# Patient Record
Sex: Male | Born: 1955
Health system: Southern US, Community
[De-identification: ages and names within clinical notes are randomized; demographics above are authoritative.]

## PROBLEM LIST (undated history)

## (undated) DIAGNOSIS — I2699 Other pulmonary embolism without acute cor pulmonale: Secondary | ICD-10-CM

## (undated) DIAGNOSIS — D6851 Activated protein C resistance: Secondary | ICD-10-CM

## (undated) DIAGNOSIS — I82409 Acute embolism and thrombosis of unspecified deep veins of unspecified lower extremity: Secondary | ICD-10-CM

## (undated) DIAGNOSIS — K219 Gastro-esophageal reflux disease without esophagitis: Secondary | ICD-10-CM

## (undated) DIAGNOSIS — Z86010 Personal history of colonic polyps: Secondary | ICD-10-CM

## (undated) DIAGNOSIS — I10 Essential (primary) hypertension: Secondary | ICD-10-CM

## (undated) DIAGNOSIS — Z86711 Personal history of pulmonary embolism: Secondary | ICD-10-CM

## (undated) DIAGNOSIS — M25552 Pain in left hip: Secondary | ICD-10-CM

## (undated) DIAGNOSIS — G8929 Other chronic pain: Secondary | ICD-10-CM

## (undated) DIAGNOSIS — E785 Hyperlipidemia, unspecified: Secondary | ICD-10-CM

## (undated) HISTORY — DX: Other pulmonary embolism without acute cor pulmonale: I26.99

## (undated) HISTORY — DX: Essential (primary) hypertension: I10

## (undated) HISTORY — DX: Personal history of colonic polyps: Z86.010

## (undated) HISTORY — DX: Gastro-esophageal reflux disease without esophagitis: K21.9

## (undated) HISTORY — DX: Pain in left hip: M25.552

## (undated) HISTORY — DX: Hyperlipidemia, unspecified: E78.5

## (undated) HISTORY — DX: Acute embolism and thrombosis of unspecified deep veins of unspecified lower extremity: I82.409

## (undated) HISTORY — DX: Personal history of pulmonary embolism: Z86.711

## (undated) HISTORY — DX: Other chronic pain: G89.29

## (undated) HISTORY — DX: Activated protein C resistance: D68.51

---
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
## 2004-04-27 ENCOUNTER — Ambulatory Visit (HOSPITAL_COMMUNITY): Admission: RE | Admit: 2004-04-27 | Discharge: 2004-04-27 | Payer: Self-pay | Admitting: Orthopedic Surgery

## 2005-04-05 ENCOUNTER — Emergency Department (HOSPITAL_COMMUNITY): Admission: EM | Admit: 2005-04-05 | Discharge: 2005-04-06 | Payer: Self-pay | Admitting: Emergency Medicine

## 2005-04-14 ENCOUNTER — Ambulatory Visit (HOSPITAL_COMMUNITY): Admission: RE | Admit: 2005-04-14 | Discharge: 2005-04-14 | Payer: Self-pay

## 2005-04-14 ENCOUNTER — Encounter (INDEPENDENT_AMBULATORY_CARE_PROVIDER_SITE_OTHER): Payer: Self-pay | Admitting: Specialist

## 2005-04-14 HISTORY — PX: CHOLECYSTECTOMY: SHX55

## 2005-05-30 ENCOUNTER — Ambulatory Visit: Payer: Self-pay | Admitting: Internal Medicine

## 2005-06-30 ENCOUNTER — Ambulatory Visit: Payer: Self-pay | Admitting: Family Medicine

## 2005-07-05 ENCOUNTER — Ambulatory Visit: Payer: Self-pay | Admitting: Family Medicine

## 2005-07-27 ENCOUNTER — Ambulatory Visit: Payer: Self-pay | Admitting: Internal Medicine

## 2005-08-15 ENCOUNTER — Ambulatory Visit: Payer: Self-pay | Admitting: Internal Medicine

## 2007-06-25 DIAGNOSIS — I10 Essential (primary) hypertension: Secondary | ICD-10-CM | POA: Insufficient documentation

## 2007-07-02 ENCOUNTER — Ambulatory Visit: Payer: Self-pay | Admitting: Family Medicine

## 2007-07-02 LAB — CONVERTED CEMR LAB
BUN: 13 mg/dL (ref 6–23)
Basophils Absolute: 0 10*3/uL (ref 0.0–0.1)
Basophils Relative: 0.4 % (ref 0.0–1.0)
Bilirubin, Direct: 0.2 mg/dL (ref 0.0–0.3)
Chloride: 110 meq/L (ref 96–112)
Cholesterol: 164 mg/dL (ref 0–200)
Eosinophils Relative: 1.9 % (ref 0.0–5.0)
GFR calc non Af Amer: 95 mL/min
Glucose, Bld: 119 mg/dL — ABNORMAL HIGH (ref 70–99)
HDL: 34 mg/dL — ABNORMAL LOW (ref >39.0)
LDL Cholesterol: 115 mg/dL — ABNORMAL HIGH (ref 0–99)
MCHC: 35.8 g/dL (ref 30.0–36.0)
Monocytes Absolute: 0.5 10*3/uL (ref 0.2–0.7)
Neutro Abs: 2.5 10*3/uL (ref 1.4–7.7)
Neutrophils Relative %: 57.3 % (ref 43.0–77.0)
RDW: 11.1 % — ABNORMAL LOW (ref 11.5–14.6)
Specific Gravity, Urine: 1.02
TSH: 1.12 microintl units/mL (ref 0.35–5.50)
Triglycerides: 75 mg/dL (ref 0–149)
Urobilinogen, UA: 0.2
VLDL: 15 mg/dL (ref 0–40)
WBC: 4.4 10*3/uL — ABNORMAL LOW (ref 4.5–10.5)

## 2007-07-09 ENCOUNTER — Ambulatory Visit: Payer: Self-pay | Admitting: Family Medicine

## 2007-10-15 ENCOUNTER — Ambulatory Visit: Payer: Self-pay | Admitting: Family Medicine

## 2007-10-15 DIAGNOSIS — M659 Synovitis and tenosynovitis, unspecified: Secondary | ICD-10-CM | POA: Insufficient documentation

## 2007-10-15 DIAGNOSIS — M7150 Other bursitis, not elsewhere classified, unspecified site: Secondary | ICD-10-CM | POA: Insufficient documentation

## 2008-02-21 ENCOUNTER — Telehealth: Payer: Self-pay | Admitting: Family Medicine

## 2008-04-21 ENCOUNTER — Telehealth: Payer: Self-pay | Admitting: Family Medicine

## 2008-08-04 ENCOUNTER — Ambulatory Visit: Payer: Self-pay | Admitting: Family Medicine

## 2008-08-04 LAB — CONVERTED CEMR LAB
Blood in Urine, dipstick: NEGATIVE
Glucose, Urine, Semiquant: NEGATIVE
Ketones, urine, test strip: NEGATIVE
Nitrite: NEGATIVE
pH: 7.5

## 2008-08-06 LAB — CONVERTED CEMR LAB
ALT: 26 units/L (ref 0–53)
AST: 26 units/L (ref 0–37)
Albumin: 4.2 g/dL (ref 3.5–5.2)
Basophils Absolute: 0 10*3/uL (ref 0.0–0.1)
Basophils Relative: 0.6 % (ref 0.0–3.0)
Calcium: 9.6 mg/dL (ref 8.4–10.5)
Chloride: 114 meq/L — ABNORMAL HIGH (ref 96–112)
Creatinine, Ser: 1 mg/dL (ref 0.4–1.5)
Eosinophils Relative: 1.3 % (ref 0.0–5.0)
HDL: 36.8 mg/dL — ABNORMAL LOW (ref >39.0)
LDL Cholesterol: 134 mg/dL — ABNORMAL HIGH (ref 0–99)
MCHC: 35.1 g/dL (ref 30.0–36.0)
MCV: 96 fL (ref 78.0–100.0)
Neutrophils Relative %: 59.2 % (ref 43.0–77.0)
Platelets: 219 10*3/uL (ref 150–400)
Potassium: 5.2 meq/L — ABNORMAL HIGH (ref 3.5–5.1)
RBC: 4.71 M/uL (ref 4.22–5.81)
Sodium: 145 meq/L (ref 135–145)
TSH: 1.31 microintl units/mL (ref 0.35–5.50)
Total CHOL/HDL Ratio: 4.9
VLDL: 9 mg/dL (ref 0–40)
WBC: 4.2 10*3/uL — ABNORMAL LOW (ref 4.5–10.5)

## 2008-08-11 ENCOUNTER — Ambulatory Visit: Payer: Self-pay | Admitting: Family Medicine

## 2008-08-11 DIAGNOSIS — E785 Hyperlipidemia, unspecified: Secondary | ICD-10-CM

## 2008-08-11 DIAGNOSIS — K219 Gastro-esophageal reflux disease without esophagitis: Secondary | ICD-10-CM | POA: Insufficient documentation

## 2008-08-13 ENCOUNTER — Telehealth: Payer: Self-pay | Admitting: Family Medicine

## 2009-03-03 ENCOUNTER — Ambulatory Visit: Payer: Self-pay | Admitting: Family Medicine

## 2009-03-03 DIAGNOSIS — R209 Unspecified disturbances of skin sensation: Secondary | ICD-10-CM | POA: Insufficient documentation

## 2009-03-04 LAB — CONVERTED CEMR LAB
Basophils Relative: 0.6 % (ref 0.0–3.0)
CO2: 31 meq/L (ref 19–32)
Calcium: 8.9 mg/dL (ref 8.4–10.5)
Creatinine, Ser: 0.9 mg/dL (ref 0.4–1.5)
Eosinophils Relative: 1.2 % (ref 0.0–5.0)
Glucose, Bld: 99 mg/dL (ref 70–99)
Lymphocytes Relative: 32.1 % (ref 12.0–46.0)
MCHC: 35 g/dL (ref 30.0–36.0)
MCV: 92.8 fL (ref 78.0–100.0)
Monocytes Absolute: 0.5 10*3/uL (ref 0.1–1.0)
Monocytes Relative: 9.3 % (ref 3.0–12.0)
Neutro Abs: 2.9 10*3/uL (ref 1.4–7.7)
Neutrophils Relative %: 56.8 % (ref 43.0–77.0)
Platelets: 199 10*3/uL (ref 150.0–400.0)
RDW: 11.4 % — ABNORMAL LOW (ref 11.5–14.6)
Sodium: 142 meq/L (ref 135–145)
WBC: 5.2 10*3/uL (ref 4.5–10.5)

## 2009-03-11 ENCOUNTER — Encounter: Payer: Self-pay | Admitting: Family Medicine

## 2009-03-17 ENCOUNTER — Encounter: Payer: Self-pay | Admitting: Family Medicine

## 2009-04-06 ENCOUNTER — Ambulatory Visit (HOSPITAL_COMMUNITY): Admission: RE | Admit: 2009-04-06 | Discharge: 2009-04-06 | Payer: Self-pay | Admitting: Neurology

## 2009-04-27 ENCOUNTER — Encounter: Payer: Self-pay | Admitting: Family Medicine

## 2009-06-16 ENCOUNTER — Ambulatory Visit: Payer: Self-pay | Admitting: Family Medicine

## 2009-06-16 DIAGNOSIS — K5289 Other specified noninfective gastroenteritis and colitis: Secondary | ICD-10-CM

## 2009-11-10 ENCOUNTER — Ambulatory Visit: Payer: Self-pay | Admitting: Family Medicine

## 2009-11-16 ENCOUNTER — Telehealth: Payer: Self-pay | Admitting: Family Medicine

## 2009-11-16 DIAGNOSIS — K645 Perianal venous thrombosis: Secondary | ICD-10-CM

## 2010-05-03 ENCOUNTER — Telehealth: Payer: Self-pay | Admitting: Family Medicine

## 2010-05-06 ENCOUNTER — Encounter: Payer: Self-pay | Admitting: Family Medicine

## 2010-08-20 ENCOUNTER — Ambulatory Visit: Payer: Self-pay | Admitting: Family Medicine

## 2010-08-20 DIAGNOSIS — F411 Generalized anxiety disorder: Secondary | ICD-10-CM | POA: Insufficient documentation

## 2010-08-23 LAB — CONVERTED CEMR LAB
Alkaline Phosphatase: 67 units/L (ref 39–117)
Chloride: 105 meq/L (ref 96–112)
Creatinine, Ser: 1 mg/dL (ref 0.4–1.5)
Eosinophils Absolute: 0.1 10*3/uL (ref 0.0–0.7)
Eosinophils Relative: 1.7 % (ref 0.0–5.0)
Lymphocytes Relative: 30 % (ref 12.0–46.0)
Lymphs Abs: 1.6 10*3/uL (ref 0.7–4.0)
MCHC: 35.2 g/dL (ref 30.0–36.0)
MCV: 95.8 fL (ref 78.0–100.0)
Monocytes Absolute: 0.5 10*3/uL (ref 0.1–1.0)
Monocytes Relative: 10.3 % (ref 3.0–12.0)
Neutrophils Relative %: 57.3 % (ref 43.0–77.0)
Platelets: 208 10*3/uL (ref 150.0–400.0)
Potassium: 4.1 meq/L (ref 3.5–5.1)
RBC: 4.7 M/uL (ref 4.22–5.81)
RDW: 11.9 % (ref 11.5–14.6)
TSH: 1.71 microintl units/mL (ref 0.35–5.50)
Total Bilirubin: 2.1 mg/dL — ABNORMAL HIGH (ref 0.3–1.2)
Total Protein: 6.7 g/dL (ref 6.0–8.3)

## 2010-09-03 ENCOUNTER — Encounter (INDEPENDENT_AMBULATORY_CARE_PROVIDER_SITE_OTHER): Payer: Self-pay | Admitting: *Deleted

## 2010-10-04 ENCOUNTER — Encounter (INDEPENDENT_AMBULATORY_CARE_PROVIDER_SITE_OTHER): Payer: Self-pay

## 2010-10-05 ENCOUNTER — Ambulatory Visit: Payer: Self-pay | Admitting: Internal Medicine

## 2010-10-19 ENCOUNTER — Ambulatory Visit: Payer: Self-pay | Admitting: Internal Medicine

## 2010-10-19 DIAGNOSIS — Z860100 Personal history of colon polyps, unspecified: Secondary | ICD-10-CM | POA: Insufficient documentation

## 2010-10-19 HISTORY — DX: Personal history of colonic polyps: Z86.010

## 2010-10-25 ENCOUNTER — Encounter: Payer: Self-pay | Admitting: Internal Medicine

## 2010-11-17 ENCOUNTER — Ambulatory Visit
Admission: RE | Admit: 2010-11-17 | Discharge: 2010-11-17 | Payer: Self-pay | Source: Home / Self Care | Attending: Sports Medicine | Admitting: Sports Medicine

## 2010-11-17 DIAGNOSIS — M79609 Pain in unspecified limb: Secondary | ICD-10-CM | POA: Insufficient documentation

## 2010-12-14 NOTE — Letter (Signed)
Summary: Physician Medical Evaluation/[facility]  Physician Medical Evaluation/[facility]   Imported By: Maryln Gottron 05/10/2010 13:13:45  _____________________________________________________________________  External Attachment:    Type:   Image     Comment:   External Document

## 2010-12-14 NOTE — Progress Notes (Signed)
Summary: has form  Phone Note Call from Patient Call back at [phone]   Caller: Patient Call For: Nelwyn Salisbury MD Summary of Call: needs health form filled out for [employer]- need OV or can he drop off? Initial call taken by: VM  Follow-up for Phone Call        he can just drop it off for me, but ask him to tell us what his weight and height are now  Follow-up by: Nelwyn Salisbury MD,  May 03, 2010 1:43 PM  Additional Follow-up for Phone Call Additional follow up Details #1::        left msg Additional Follow-up by: Raechel Ache, RN,  May 03, 2010 2:05 PM

## 2010-12-14 NOTE — Letter (Signed)
Summary: Pre Visit Letter Revised  [facility]  [address] [place], [place] [address]   Phone: [phone]  Fax: [phone]        09/03/2010 MRN: [id number] Joe Molina [address] [place], [place]  [address]             Procedure Date:  10/19/2010   Welcome to the Gastroenterology Division at [facility].    You are scheduled to see a nurse for your pre-procedure visit on 10/05/2010 at 4:30PM on the 3rd floor at [facility], [address]  We ask that you try to arrive at our office 15 minutes prior to your appointment time to allow for check-in.  Please take a minute to review the attached form.  If you answer "Yes" to one or more of the questions on the first page, we ask that you call the person listed at your earliest opportunity.  If you answer "No" to all of the questions, please complete the rest of the form and bring it to your appointment.    Your nurse visit will consist of discussing your medical and surgical history, your immediate family medical history, and your medications.   If you are unable to list all of your medications on the form, please bring the medication bottles to your appointment and we will list them.  We will need to be aware of both prescribed and over the counter drugs.  We will need to know exact dosage information as well.    Please be prepared to read and sign documents such as consent forms, a financial agreement, and acknowledgement forms.  If necessary, and with your consent, a friend or relative is welcome to sit-in on the nurse visit with you.  Please bring your insurance card so that we may make a copy of it.  If your insurance requires a referral to see a specialist, please bring your referral form from your primary care physician.  No co-pay is required for this nurse visit.     If you cannot keep your appointment, please call [phone] to cancel or reschedule prior to your appointment date.  This allows [place]  the opportunity to schedule an appointment for another patient in need of care.    Thank you for choosing [facility] for your medical needs.  We appreciate the opportunity to care for you.  Please visit [place] at our website  to learn more about our practice.  Sincerely, The Gastroenterology Division

## 2010-12-14 NOTE — Progress Notes (Signed)
Summary: referral  Phone Note Call from Patient Call back at Home Phone [phone]   Caller: Patient Summary of Call: Pt. sent me an email over the weekend that his hemorrhoids are still bothering him a lot despite the medicated cream. Would like a referral about these. Please refer him to Surgery this week about a painful thrombosed hemorrhoid.  Initial call taken by: Nelwyn Salisbury MD,  November 16, 2009 9:17 AM  Follow-up for Phone Call        Phone Call Completed Follow-up by: Alfred Levins, CMA,  November 16, 2009 11:57 AM  New Problems: HEMORRHOID, EXTERNAL, THROMBOSED (ICD-455.4)   New Problems: HEMORRHOID, EXTERNAL, THROMBOSED (ICD-455.4)

## 2010-12-14 NOTE — Letter (Signed)
Summary: [facility] Instructions  [facility]  [address] [place], [place] [address]   Phone: [phone]  Fax: [phone]       Joe Molina    [birth year]    MRN: [id number]        Procedure Day /Date: Tuesday 10-19-10     Arrival Time: 8:00 a.m.     Procedure Time: 9:00 a.m.     Location of Procedure:                    _x _  [facility] (4th Floor)                        PREPARATION FOR COLONOSCOPY WITH MOVIPREP   Starting 5 days prior to your procedure  10-14-10 do not eat nuts, seeds, popcorn, corn, beans, peas,  salads, or any raw vegetables.  Do not take any fiber supplements (e.g. Metamucil, Citrucel, and Benefiber).  THE DAY BEFORE YOUR PROCEDURE         DATE:  10-18-10  DAY:  Monday  1.  Drink clear liquids the entire day-NO SOLID FOOD  2.  Do not drink anything colored red or purple.  Avoid juices with pulp.  No orange juice.  3.  Drink at least 64 oz. (8 glasses) of fluid/clear liquids during the day to prevent dehydration and help the prep work efficiently.  CLEAR LIQUIDS INCLUDE: Water Jello Ice Popsicles Tea (sugar ok, no milk/cream) Powdered fruit flavored drinks Coffee (sugar ok, no milk/cream) Gatorade Juice: apple, white grape, white cranberry  Lemonade Clear bullion, consomm, broth Carbonated beverages (any kind) Strained chicken noodle soup Hard Candy                             4.  In the morning, mix first dose of MoviPrep solution:    Empty 1 Pouch A and 1 Pouch B into the disposable container    Add lukewarm drinking water to the top line of the container. Mix to dissolve    Refrigerate (mixed solution should be used within 24 hrs)  5.  Begin drinking the prep at 5:00 p.m. The MoviPrep container is divided by 4 marks.   Every 15 minutes drink the solution down to the next mark (approximately 8 oz) until the full liter is complete.   6.  Follow completed prep with 16 oz of clear liquid of your choice  (Nothing red or purple).  Continue to drink clear liquids until bedtime.  7.  Before going to bed, mix second dose of MoviPrep solution:    Empty 1 Pouch A and 1 Pouch B into the disposable container    Add lukewarm drinking water to the top line of the container. Mix to dissolve    Refrigerate  THE DAY OF YOUR PROCEDURE      DATE:  10-19-10  DAY:  Tuesday  Beginning at  4:00 a.m. (5 hours before procedure):         1. Every 15 minutes, drink the solution down to the next mark (approx 8 oz) until the full liter is complete.  2. Follow completed prep with 16 oz. of clear liquid of your choice.    3. You may drink clear liquids until  7:00 a.m. (2 HOURS BEFORE PROCEDURE).   MEDICATION INSTRUCTIONS  Unless otherwise instructed, you should take regular prescription medications with a small sip of water  as early as possible the morning of your procedure.         OTHER INSTRUCTIONS  You will need a responsible adult at least 56 years of age to accompany you and drive you home.   This person must remain in the waiting room during your procedure.  Wear loose fitting clothing that is easily removed.  Leave jewelry and other valuables at home.  However, you may wish to bring a book to read or  an iPod/MP3 player to listen to music as you wait for your procedure to start.  Remove all body piercing jewelry and leave at home.  Total time from sign-in until discharge is approximately 2-3 hours.  You should go home directly after your procedure and rest.  You can resume normal activities the  day after your procedure.  The day of your procedure you should not:   Drive   Make legal decisions   Operate machinery   Drink alcohol   Return to work  You will receive specific instructions about eating, activities and medications before you leave.    The above instructions have been reviewed and explained to me by   Ulis Rias RN  October 05, 2010 5:17 PM     I fully  understand and can verbalize these instructions _____________________________ Date _________

## 2010-12-14 NOTE — Assessment & Plan Note (Signed)
Summary: CONSULT RE: HIGH BP READINGS/CJR   Vital Signs:  Patient profile:   55 year old male Weight:      204 pounds O2 Sat:      97 % Temp:     99 degrees F Pulse rate:   61 / minute BP sitting:   140 / 104  (left arm) Cuff size:   large  Vitals Entered By: Pura Spice, RN (August 20, 2010 10:01 AM)  Serial Vital Signs/Assessments:  Time      Position  BP       Pulse  Resp  Temp     By           R Arm     146/104                        Pura Spice, RN   History of Present Illness: Here for elevated BP readings in the past few weeks. He has been getting systolics of 150-160 and diastolics of 90-100. He has felt a pressure in the head, but no HA. His tinnitus has been actin g up. No chest pain or SOB. He admits to not exercising and being under a lot of stress, both from family issues and from work. He took Lisinopril several years ago and tolerated it well.   Allergies: 1)  Codeine Sulfate (Codeine Sulfate)  Past History:  Past Medical History: Reviewed history from 08/11/2008 and no changes required. Hypertension GERD Hyperlipidemia  Past Surgical History: Reviewed history from 08/11/2008 and no changes required. Cholecystectomy 6-06 per Dr. Orson Slick colonoscopy 08-15-05 per Dr. Leone Payor, repeat in 5 yrs  Review of Systems  The patient denies anorexia, fever, weight loss, weight gain, vision loss, decreased hearing, hoarseness, chest pain, syncope, dyspnea on exertion, peripheral edema, prolonged cough, headaches, hemoptysis, abdominal pain, melena, hematochezia, severe indigestion/heartburn, hematuria, incontinence, genital sores, muscle weakness, suspicious skin lesions, transient blindness, difficulty walking, depression, unusual weight change, abnormal bleeding, enlarged lymph nodes, angioedema, breast masses, and testicular masses.    Physical Exam  General:  Well-developed,well-nourished,in no acute distress; alert,appropriate and cooperative throughout  examination Neck:  No deformities, masses, or tenderness noted. Lungs:  Normal respiratory effort, chest expands symmetrically. Lungs are clear to auscultation, no crackles or wheezes. Heart:  Normal rate and regular rhythm. S1 and S2 normal without gallop, murmur, click, rub or other extra sounds. Psych:  Oriented X3, memory intact for recent and remote, normally interactive, good eye contact, and slightly anxious.     Impression & Recommendations:  Problem # 1:  HYPERTENSION (ICD-401.9)  His updated medication list for this problem includes:    Lisinopril 10 Mg Tabs (Lisinopril) ..... Once daily  Orders: Venipuncture (71696) TLB-BMP (Basic Metabolic Panel-BMET) (80048-METABOL) TLB-CBC Platelet - w/Differential (85025-CBCD) TLB-Hepatic/Liver Function Pnl (80076-HEPATIC) TLB-TSH (Thyroid Stimulating Hormone) (84443-TSH) Specimen Handling (78938)  Problem # 2:  ANXIETY STATE, UNSPECIFIED (ICD-300.00)  Complete Medication List: 1)  Fish Oil Oil (Fish oil) .... Once daily 2)  Red Yeast Rice 600 Mg Caps (Red yeast rice extract) .... Once daily 3)  Nexium 40 Mg Cpdr (Esomeprazole magnesium) .... Take 1 tab each morning 4)  Anusol-hc 2.5 % Crea (Hydrocortisone) .... Apply three times a day as needed 5)  Lisinopril 10 Mg Tabs (Lisinopril) .... Once daily  Patient Instructions: 1)  We will get back on Lisinopril today and check some labs. I encouraged him to exercise and try to do something fun this weekend. he will  contact Dr. Caralyn Guile to start some therapy also.  2)  Please schedule a follow-up appointment in 1 month.  Prescriptions: LISINOPRIL 10 MG TABS (LISINOPRIL) once daily  #30 x 5   Entered and Authorized by:   Nelwyn Salisbury MD   Signed by:   Nelwyn Salisbury MD on 08/20/2010   Method used:   Electronically to        [facility]* (retail)       [address]       [address] Shipping/mailing       [place], [place]  [address]       Ph:  [phone]       Fax: [phone]   RxID:   [id number]

## 2010-12-14 NOTE — Miscellaneous (Signed)
Summary: Lec previsit  Clinical Lists Changes  Medications: Added new medication of MOVIPREP 100 GM  SOLR (PEG-KCL-NACL-NASULF-NA ASC-C) As per prep instructions. - Signed Rx of MOVIPREP 100 GM  SOLR (PEG-KCL-NACL-NASULF-NA ASC-C) As per prep instructions.;  #1 x 0;  Signed;  Entered by: Ulis Rias RN;  Authorized by: Iva Boop MD, Clementeen Graham;  Method used: Electronically to [facility]*, [address], [address] Shipping/mailing, [place], [place]  [address], Ph: [phone], Fax: [phone] Observations: Added new observation of ALLERGY REV: Done (10/05/2010 16:17)    Prescriptions: MOVIPREP 100 GM  SOLR (PEG-KCL-NACL-NASULF-NA ASC-C) As per prep instructions.  #1 x 0   Entered by:   Ulis Rias RN   Authorized by:   Iva Boop MD, [facility]   Signed by:   Ulis Rias RN on 10/05/2010   Method used:   Electronically to        [facility]* (retail)       [address]       [address] Shipping/mailing       [place], [place]  [address]       Ph: [phone]       Fax: [phone]   RxID:   [id number]

## 2010-12-16 NOTE — Procedures (Signed)
Summary: Colonoscopy  Patient: [name] Note: All result statuses are Final unless otherwise noted.  Tests: (1) Colonoscopy (COL)   COL Colonoscopy           DONE (C)     [facility]     [address]     [place], [place]  [address]           COLONOSCOPY PROCEDURE REPORT           PATIENT:  Joe Molina, Joe Molina  MR#:  [id number]     BIRTHDATE:  [birth year], 54 yrs. old  GENDER:  male     ENDOSCOPIST:  Iva Boop, MD, [facility]           PROCEDURE DATE:  10/19/2010     PROCEDURE:  Colonoscopy with biopsy and snare polypectomy     ASA CLASS:  Class II     INDICATIONS:  Routine Risk Screening returned at years due to deep     folds in sigmoid (thought to limit visualization)     MEDICATIONS:  None CORRECTION: Fentalyl 50 micrograms IV and     Versed 4 milligrams IV           DESCRIPTION OF PROCEDURE:   After the risks benefits and     alternatives of the procedure were thoroughly explained, informed     consent was obtained.  No rectal exam performed. The LB CF-[id number]     [id number] endoscope was introduced through the anus and advanced to     the cecum, which was identified by both the appendix and ileocecal     valve, without limitations.  The quality of the prep was good,     using MoviPrep.  The instrument was then slowly withdrawn as the     colon was fully examined. Insertion: 1:50 minutes Withdrawal:     13:06 minutes     <<PROCEDUREIMAGES>>           FINDINGS:  Two polyps were found in the right colon. 1) 1-2 mm     cecal polyp (cold biopsy removal). 2) 1 cm sessile polyp hot     snared from ascending colon and sent to pathology. Moderate     diverticulosis was found. Some associated deep folds but was able     to explore well today.  This was otherwise a normal examination of     the colon. This includes right colon retroflexion.   Retroflexed     views in the rectum revealed no abnormalities.    The scope was     then withdrawn from the patient and the procedure  completed.     COMPLICATIONS:  None     ENDOSCOPIC IMPRESSION:     1) Two polyps removed from the right colon (1 cm and 1-2 mm)     2) Moderate diverticulosis     3) Otherwise normal examination, good prep     RECOMMENDATIONS:     1) Hold aspirin, aspirin products, and anti-inflammatory     medication for 2 weeks.           REPEAT EXAM:  In for Colonoscopy, pending biopsy results.           Iva Boop, MD, Clementeen Graham           CC:  Nelwyn Salisbury, MD     The Patient           n.     REVISED:  10/21/2010 12:05 PM  eSIGNED:   Iva Boop at 10/21/2010 12:05 PM           Elwanda Brooklyn, [id number]  Note: An exclamation mark (!) indicates a result that was not dispersed into the flowsheet. Document Creation Date: 10/21/2010 12:05 PM _______________________________________________________________________  (1) Order result status: Final Collection or observation date-time: 10/19/2010 09:49 Requested date-time:  Receipt date-time:  Reported date-time:  Referring Physician:   Ordering Physician: Stan Head [id number]) Specimen Source:  Source: Launa Grill Order Number: [id number] Lab site:   Appended Document: Colonoscopy   Colonoscopy  Procedure date:  10/19/2010  Findings:         1) Two polyps removed from the right colon (1 cm and 1-2 mm)     2) Moderate diverticulosis     3) Otherwise normal examination, good prep  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  NO HIGH GRADE DYSPLASIA OR MALIGNANCY IDENTIFIED.  Procedures Next Due Date:    Colonoscopy: 10/2013   Appended Document: Colonoscopy     Procedures Next Due Date:    Colonoscopy: 10/2013

## 2010-12-16 NOTE — Assessment & Plan Note (Signed)
Summary: SORE RT HEEL/NP/LP   Vital Signs:  Patient profile:   55 year old male Height:      70 inches Weight:      195 pounds BMI:     28.08 Pulse rate:   65 / minute BP sitting:   125 / 86  (right arm)  Vitals Entered By: Rochele Pages RN (November 17, 2010 2:32 PM) CC: rt heel pain x2 months   Primary Provider:  [facility]  CC:  rt heel pain x2 months.  History of Present Illness: Pt presents to clinic for evaluation of rt heel pain, plantar aspect x 2 months.   He walks 14 miles per week for exercises- pain worse with walking, and in evenings.   Has taken ibuprofen 3-4 times since pain started this was helpful.  In fall did some walking barefoot but no specific injury did note stepping on gravel; in drive way walking dog  Later in day hurts worse no real first step pain  no real medicial probs except mild HBP No DJD  Preventive Screening-Counseling & Management  Alcohol-Tobacco     Smoking Status: never  Allergies: 1)  Codeine Sulfate (Codeine Sulfate)  Physical Exam  General:  Well-developed,well-nourished,in no acute distress; alert,appropriate and cooperative throughout examination Msk:  normal arch RT normal great toe motion slt tightnes sof arch non really TTP over PF insertion there is mild TTP over os calcis no swelling normal gait Additional Exam:  MSK [place] there is partially calcified "stone bruise" ins oft tissue over os calcis PF is 0.47 and appears norm no abnormal doppler flow calcaneus looks normal   Impression & Recommendations:  Problem # 1:  HEEL PAIN (ICD-729.5)  Orders: Heel Cup (N8295) [place] LIMITED (62130)  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  Once daily 2)  Red Yeast Rice 600 Mg Caps (Red yeast rice extract) .... Once daily 3)  Nexium 40 Mg Cpdr (Esomeprazole magnesium) .... Take 1 tab each morning 4)  Anusol-hc 2.5 % Crea (Hydrocortisone) .... Apply three times a day as needed 5)  Lisinopril 10 Mg Tabs (Lisinopril) .... Once daily   Orders Added: 1)  Heel Cup [L3649] 2)  Est. Patient Level III [86578] 3)  [place] LIMITED [46962]

## 2010-12-16 NOTE — Letter (Signed)
Summary: Patient Notice- Polyp Results  [facility]  [address] [place], [place] [address]   Phone: [phone]  Fax: [phone]        October 25, 2010 MRN: [id number]    Aadvik Villalpando [address] [place], [place]  [address]    Dear Danelle Earthly,  The polyps removed from your colon were adenomatous. This means that they were pre-cancerous or that  they had the potential to change into cancer over time.   I recommend that you have a repeat colonoscopy in 3 years to determine if you have developed any new polyps over time and screen for colorectal cancer. If you develop any new rectal bleeding, abdominal pain or significant bowel habit changes, please contact us before then.  In addition to repeating colonoscopy, changing health habits may reduce your risk of having more colon or rectal  polyps and possibly, colorectal cancer. You may lower your risk of future polyps and colorectal cancer by adopting healthy habits such as not smoking or using tobacco (if you do), being physically active, losing weight (if overweight), and eating a diet which includes fruits and vegetables and limits red meat.  Please call us if you are having persistent problems or have questions about your condition that have not been fully answered at this time.  Sincerely,  Ashley Murrain MD, [facility]  This letter has been electronically signed by your physician.  Appended Document: Patient Notice- Polyp Results Letter mailed

## 2011-01-27 ENCOUNTER — Ambulatory Visit (INDEPENDENT_AMBULATORY_CARE_PROVIDER_SITE_OTHER): Payer: Self-pay | Admitting: Sports Medicine

## 2011-01-27 ENCOUNTER — Encounter: Payer: Self-pay | Admitting: Sports Medicine

## 2011-01-27 DIAGNOSIS — M79609 Pain in unspecified limb: Secondary | ICD-10-CM

## 2011-02-01 NOTE — Assessment & Plan Note (Signed)
Summary: f/u foot,mc   Vital Signs:  Patient profile:   55 year old male BP sitting:   110 / 80  Vitals Entered By: Lillia Pauls CMA (January 27, 2011 11:56 AM)  Primary Provider:  [facility]  CC:  f/u R heel.  History of Present Illness: Patient to office today for followup of right heel pain. Had minimal improvement with the heel cups  and was causing more discomfort. has been using over-the-counter orthotics for last 2-weeks which are more comfortable. Has noted some intermittent ankle pain on the right, no swelling or instability. Denies any injury or trauma.  Allergies: 1)  Codeine Sulfate (Codeine Sulfate)  Physical Exam  General:  Well-developed,well-nourished,in no acute distress; alert,appropriate and cooperative throughout examination Msk:  FEET: TTP over os calcis, no surrounding erythema, bruising, or swelling.  No significant TTP over PF insertion.  Normal long & transverse arches.    GAIT: no leg length difference, walks without a limp.  No over pronation or supination noted.  Neurovascularly intact distally Additional Exam:  MSK U/S:  right foot -  again noted to have a partially calcified area in the soft tissue over the os calcis. does have small bone spur in this area off calcaneus with some surrounding fluid edema.  some increased Doppler flow noted in the surrounding soft tissue. Plantar fascia again appears normal. Images saved.   Impression & Recommendations:  Problem # 1:  HEEL PAIN (ICD-729.5) Assessment Unchanged - related to symptomatic os calcis. ultrasound again shows surrounding edema in this area, but plantar fascia appears normal.  - Fitted with heal cushion using blue EVA foam with cut out placed on heel of his shoe insert which was comfortable in the office today. should try this for the next 2 weeks, if symptoms return should contact us and we'll schedule him for custom orthotics.  Complete Medication List: 1)  Fish Oil Oil (Fish oil) .... Once  daily 2)  Red Yeast Rice 600 Mg Caps (Red yeast rice extract) .... Once daily 3)  Nexium 40 Mg Cpdr (Esomeprazole magnesium) .... Take 1 tab each morning 4)  Anusol-hc 2.5 % Crea (Hydrocortisone) .... Apply three times a day as needed 5)  Lisinopril 10 Mg Tabs (Lisinopril) .... Once daily   Orders Added: 1)  Est. Patient Level III [[address]]

## 2011-03-31 ENCOUNTER — Other Ambulatory Visit: Payer: Self-pay | Admitting: *Deleted

## 2011-03-31 MED ORDER — LISINOPRIL 10 MG PO TABS: 10.0000 mg | ORAL_TABLET | Freq: Every day | ORAL | Status: DC

## 2011-04-01 NOTE — Op Note (Signed)
NAMEPRESTIN, Joe Molina                   ACCOUNT NO.:  [id number]   MEDICAL RECORD NO.:  [id number]          PATIENT TYPE:  OIB   LOCATION:  2852                         FACILITY:  [facility]   PHYSICIAN:  Lorre Munroe., M.D.DATE OF BIRTH:  [birth year]   DATE OF PROCEDURE:  04/14/2005  DATE OF DISCHARGE:                                 OPERATIVE REPORT   PREOPERATIVE DIAGNOSES:  1.  Cholelithiasis.  2.  Subacute cholecystitis.   POSTOPERATIVE DIAGNOSES:  1.  Cholelithiasis.  2.  Subacute cholecystitis.   OPERATION:  Laparoscopic cholecystectomy.   SURGEON:  Zigmund Daniel, M.D.   ASSISTANT:  Sandria Bales. Ezzard Standing, M.D.   ANESTHESIA:  General. Tera Mater. Clent Ridges, M.D.   PROCEDURE:  After the patient was monitored and anesthetized and had routine  preparation and draping of the abdomen, I infiltrated local anesthetic just  below the umbilicus and made about a 2 cm transverse incision.  I dissected  down to the abdominal fascia and opened that in the midline, then bluntly  opened the peritoneum with a Kelly clamp and dilated that hole with my  finger.  I put a 0 Vicryl pursestring suture in the anterior fascia and  secured a Hasson cannula and inflated the abdomen with CO2.  On laparoscopy  I found that the gallbladder was somewhat distended and had a subacutely  inflamed appearance.  The intestines, stomach and other viscera had a normal  appearance.  I then anesthetized three additional sites and put in a 10 mm  epigastric port and two 5 mm right midabdominal ports.  With the patient  positioned head-up, foot-down, and tilted to the left, I retracted the  fundus of the gallbladder toward the right shoulder and took down adhesions,  which were both chronic and acute in appearance.  Some cautery was necessary.  When I could see the infundibulum of the  gallbladder, I grasped it and pulled it laterally and dissected in the  hepatoduodenal ligament, clearly identifying the emergence of the  cystic  duct from the infundibulum of the gallbladder.  I also dissected out the  cystic artery as it crossed the triangle of Calot going out onto the  gallbladder.  I placed a clip on the proximal cystic duct as it emerged from  the infundibulum and clipped the cystic artery with three clips.  I made a  small hole in the cystic duct, and put in a Mixter cholangiogram catheter  and secured that with a clip.  I then performed a fluoroscopic  cholangiogram, which showed normal biliary anatomy with free flow of  contrast material into the duodenum and normal-sized ducts and no detectable  filling defects.  I then resumed laparoscopy and removed the clip and  cholangiogram catheter, then clipped the distal cystic duct with three clips  and divided it.  I also divided the cystic artery.  Then using the cautery,  I dissected the gallbladder from the liver and removed it intact.  Hemostasis was very easily obtained with the cautery.  I then inspected the  gallbladder fossa and saw no evidence  of any accessory bile ducts or any  leakage of bile and saw that the clips on the cystic duct and cystic artery  were secure.  I placed the gallbladder in a plastic pouch and removed it  through the umbilical incision and tied the pursestring suture.  I again  inspected the right upper quadrant saw no sign of  bleeding.  I removed the two lateral ports under direct view of the  laparoscopic and saw no bleeding from the abdominal wall.  I then allowed  the CO2 to escape and removed the epigastric port.  I closed all the skin  incisions with intracuticular 4-0 Vicryl and Steri-Strips.  The patient was  stable throughout the operation.      WB/MEDQ  D:  04/14/2005  T:  04/14/2005  Job:  [id number]

## 2011-05-24 ENCOUNTER — Other Ambulatory Visit: Payer: Self-pay | Admitting: Family Medicine

## 2011-08-23 ENCOUNTER — Telehealth: Payer: Self-pay | Admitting: Family Medicine

## 2011-08-23 MED ORDER — ESOMEPRAZOLE MAGNESIUM 40 MG PO CPDR: 40.0000 mg | DELAYED_RELEASE_CAPSULE | Freq: Every day | ORAL | Status: DC

## 2011-08-23 NOTE — Telephone Encounter (Signed)
Script sent e-scribe 

## 2011-09-22 ENCOUNTER — Telehealth: Payer: Self-pay | Admitting: Family Medicine

## 2011-09-22 ENCOUNTER — Ambulatory Visit (INDEPENDENT_AMBULATORY_CARE_PROVIDER_SITE_OTHER): Payer: 59 | Admitting: Sports Medicine

## 2011-09-22 VITALS — BP 128/80

## 2011-09-22 DIAGNOSIS — M79609 Pain in unspecified limb: Secondary | ICD-10-CM

## 2011-09-22 NOTE — Telephone Encounter (Signed)
Patient will need a refill on Lisinopril by next week. I made him an appt for a CPE on Nov. 30, FYI. He uses [facility]. Thanks!

## 2011-09-22 NOTE — Progress Notes (Signed)
  Subjective:    Patient ID: Joe Molina, male    DOB: 1956-08-14, 55 y.o.   MRN: [id number]  HPI Patient returns with persistent pain over his right heel. He has tried numerous over-the-counter inserts but has not had any relief. We tried making some adjustments after an ultrasound showed a calcification under his calcaneus but no true plantar fasciitis  Today he has 1. Shoes that he can wear without a lot of pain but has a cushioned pad in the heel He can wear one type of sport shoe that does not create much pain   Review of Systems     Objective:   Physical Exam  No acute distress Discrete area of tenderness at the midportion of the right calcaneus Stretch and palpation of the plantar fascia does not create pain He has a moderately high arch Achilles tendon looks normal      Assessment & Plan:

## 2011-09-22 NOTE — Assessment & Plan Note (Addendum)
Today we decided to try to use a custom orthotic with a he'll cut out on the right to see if we could take the pressure off of his calcaneus and also give him some arch support. We will place him in slight supination so he doesn't get as much pressure on the medial heel  Patient was fitted for a standard, cushioned, semi-rigid orthotic.  The orthotic was heated and the patient stood on the orthotic blank positioned on the orthotic stand. The patient was positioned in subtalar neutral position and 10 degrees of ankle dorsiflexion in a weight bearing stance. After molding, a stable Fast-Tech EVA base was applied to the orthotic blank.   The blank was ground to a stable position for weight bearing. Size: 13 blue swirl base: Blue EVA posting: special cutout to RT base to float the calcaneus additional orthotic padding: none Time 40 mins  After completion of the orthotic his gait is more comfortable and we had him in a slight degree of supination bilaterally  We will recheck pending response

## 2011-09-23 MED ORDER — LISINOPRIL 10 MG PO TABS: 10.0000 mg | ORAL_TABLET | Freq: Every day | ORAL | Status: DC

## 2011-09-23 NOTE — Telephone Encounter (Signed)
rx sent in electronically 

## 2011-10-05 ENCOUNTER — Other Ambulatory Visit (INDEPENDENT_AMBULATORY_CARE_PROVIDER_SITE_OTHER): Payer: 59

## 2011-10-05 DIAGNOSIS — Z0000 Encounter for general adult medical examination without abnormal findings: Secondary | ICD-10-CM

## 2011-10-05 LAB — CBC WITH DIFFERENTIAL/PLATELET
Basophils Relative: 0.6 % (ref 0.0–3.0)
Eosinophils Absolute: 0.1 10*3/uL (ref 0.0–0.7)
Eosinophils Relative: 1.8 % (ref 0.0–5.0)
Lymphocytes Relative: 33.1 % (ref 12.0–46.0)
Monocytes Absolute: 0.4 10*3/uL (ref 0.1–1.0)
Monocytes Relative: 9.7 % (ref 3.0–12.0)
Neutro Abs: 2.5 10*3/uL (ref 1.4–7.7)
RDW: 12.4 % (ref 11.5–14.6)

## 2011-10-05 LAB — PSA: PSA: 1.88 ng/mL (ref 0.10–4.00)

## 2011-10-05 LAB — LIPID PANEL
Cholesterol: 202 mg/dL — ABNORMAL HIGH (ref 0–200)
Triglycerides: 129 mg/dL (ref 0.0–149.0)
VLDL: 25.8 mg/dL (ref 0.0–40.0)

## 2011-10-05 LAB — BASIC METABOLIC PANEL
Calcium: 9.3 mg/dL (ref 8.4–10.5)
Chloride: 105 mEq/L (ref 96–112)
Creatinine, Ser: 1 mg/dL (ref 0.4–1.5)
GFR: 87.11 mL/min (ref >60.00)
Glucose, Bld: 105 mg/dL — ABNORMAL HIGH (ref 70–99)
Potassium: 4.7 mEq/L (ref 3.5–5.1)
Sodium: 140 mEq/L (ref 135–145)

## 2011-10-05 LAB — POCT URINALYSIS DIPSTICK
Bilirubin, UA: NEGATIVE
Nitrite, UA: NEGATIVE

## 2011-10-05 LAB — HEPATIC FUNCTION PANEL
Albumin: 4.2 g/dL (ref 3.5–5.2)
Bilirubin, Direct: 0.3 mg/dL (ref 0.0–0.3)
Total Protein: 6.8 g/dL (ref 6.0–8.3)

## 2011-10-05 LAB — TSH: TSH: 1.68 u[IU]/mL (ref 0.35–5.50)

## 2011-10-07 ENCOUNTER — Other Ambulatory Visit: Payer: 59

## 2011-10-13 ENCOUNTER — Encounter: Payer: Self-pay | Admitting: Family Medicine

## 2011-10-13 NOTE — Progress Notes (Signed)
Quick Note:  Left voice message ______ 

## 2011-10-14 ENCOUNTER — Encounter: Payer: Self-pay | Admitting: Family Medicine

## 2011-10-14 ENCOUNTER — Ambulatory Visit (INDEPENDENT_AMBULATORY_CARE_PROVIDER_SITE_OTHER): Payer: 59 | Admitting: Family Medicine

## 2011-10-14 VITALS — BP 128/86 | HR 94 | Temp 98.3°F | Ht 69.0 in | Wt 202.0 lb

## 2011-10-14 DIAGNOSIS — I10 Essential (primary) hypertension: Secondary | ICD-10-CM

## 2011-10-14 DIAGNOSIS — Z0000 Encounter for general adult medical examination without abnormal findings: Secondary | ICD-10-CM

## 2011-10-14 MED ORDER — LISINOPRIL 10 MG PO TABS: 10.0000 mg | ORAL_TABLET | Freq: Every day | ORAL | Status: DC

## 2011-10-14 MED ORDER — ESOMEPRAZOLE MAGNESIUM 40 MG PO CPDR: 40.0000 mg | DELAYED_RELEASE_CAPSULE | Freq: Every day | ORAL | Status: DC

## 2011-10-14 NOTE — Progress Notes (Signed)
  Subjective:    Patient ID: Joe Molina, male    DOB: Nov 20, 1955, 55 y.o.   MRN: [id number]  HPI 55 yr old male for a cpx. He feels fine except for some chronic heel pain. He has been seeing Dr. Darrick Penna about this, and he recently had some molded inserts made.    Review of Systems  Constitutional: Negative.   HENT: Negative.   Eyes: Negative.   Respiratory: Negative.   Cardiovascular: Negative.   Gastrointestinal: Negative.   Genitourinary: Negative.   Musculoskeletal: Negative.   Skin: Negative.   Neurological: Negative.   Hematological: Negative.   Psychiatric/Behavioral: Negative.        Objective:   Physical Exam  Constitutional: He is oriented to person, place, and time. He appears well-developed and well-nourished. No distress.  HENT:  Head: Normocephalic and atraumatic.  Right Ear: External ear normal.  Left Ear: External ear normal.  Nose: Nose normal.  Mouth/Throat: Oropharynx is clear and moist. No oropharyngeal exudate.  Eyes: Conjunctivae and EOM are normal. Pupils are equal, round, and reactive to light. Right eye exhibits no discharge. Left eye exhibits no discharge. No scleral icterus.  Neck: Neck supple. No JVD present. No tracheal deviation present. No thyromegaly present.  Cardiovascular: Normal rate, regular rhythm, normal heart sounds and intact distal pulses.  Exam reveals no gallop and no friction rub.   No murmur heard.      EKG normal   Pulmonary/Chest: Effort normal and breath sounds normal. No respiratory distress. He has no wheezes. He has no rales. He exhibits no tenderness.  Abdominal: Soft. Bowel sounds are normal. He exhibits no distension and no mass. There is no tenderness. There is no rebound and no guarding.  Genitourinary: Rectum normal, prostate normal and penis normal. Guaiac negative stool. No penile tenderness.  Musculoskeletal: Normal range of motion. He exhibits no edema and no tenderness.  Lymphadenopathy:    He has no cervical  adenopathy.  Neurological: He is alert and oriented to person, place, and time. He has normal reflexes. No cranial nerve deficit. He exhibits normal muscle tone. Coordination normal.  Skin: Skin is warm and dry. No rash noted. He is not diaphoretic. No erythema. No pallor.  Psychiatric: He has a normal mood and affect. His behavior is normal. Judgment and thought content normal.          Assessment & Plan:  Well exam. He will work on diet and exercise.

## 2012-06-01 ENCOUNTER — Telehealth: Payer: Self-pay | Admitting: Family Medicine

## 2012-06-01 DIAGNOSIS — L989 Disorder of the skin and subcutaneous tissue, unspecified: Secondary | ICD-10-CM

## 2012-06-01 NOTE — Telephone Encounter (Signed)
I called and left below message.

## 2012-06-01 NOTE — Telephone Encounter (Signed)
The referral was done  

## 2012-06-01 NOTE — Telephone Encounter (Signed)
Caller: Joe Molina/Patient; PCP: Joe Molina.; CB#: [phone]; ; ; Call regarding Wants Referral To Dermatologist; has some moles he wants evaluated and biopsied.   Has not been evaluated regarding moles since last well visit 11/12.  Declines new appt at this time; would like referral to dermatology based on [facility].  Would like [name] at [facility] if possible.   INFO TO OFFICE FOR PROVIDER REVIEW/REFERRAL/CALLBACK. MAY REACH PATIENT [phone].

## 2012-06-07 ENCOUNTER — Other Ambulatory Visit: Payer: Self-pay | Admitting: Dermatology

## 2012-10-15 ENCOUNTER — Other Ambulatory Visit: Payer: Self-pay | Admitting: Family Medicine

## 2012-11-15 ENCOUNTER — Other Ambulatory Visit (INDEPENDENT_AMBULATORY_CARE_PROVIDER_SITE_OTHER): Payer: 59

## 2012-11-15 DIAGNOSIS — Z0000 Encounter for general adult medical examination without abnormal findings: Secondary | ICD-10-CM

## 2012-11-15 LAB — CBC WITH DIFFERENTIAL/PLATELET
Eosinophils Absolute: 0.1 10*3/uL (ref 0.0–0.7)
Eosinophils Relative: 2.2 % (ref 0.0–5.0)
Hemoglobin: 14.9 g/dL (ref 13.0–17.0)
Lymphs Abs: 1.3 10*3/uL (ref 0.7–4.0)
MCV: 95.2 fl (ref 78.0–100.0)
Monocytes Absolute: 0.5 10*3/uL (ref 0.1–1.0)
Monocytes Relative: 11 % (ref 3.0–12.0)
Neutrophils Relative %: 56.1 % (ref 43.0–77.0)
RBC: 4.51 Mil/uL (ref 4.22–5.81)
WBC: 4.5 10*3/uL (ref 4.5–10.5)

## 2012-11-15 LAB — BASIC METABOLIC PANEL
CO2: 26 mEq/L (ref 19–32)
Chloride: 105 mEq/L (ref 96–112)
Creatinine, Ser: 0.9 mg/dL (ref 0.4–1.5)
Glucose, Bld: 109 mg/dL — ABNORMAL HIGH (ref 70–99)
Potassium: 4.1 mEq/L (ref 3.5–5.1)

## 2012-11-15 LAB — LIPID PANEL
Total CHOL/HDL Ratio: 5
VLDL: 15 mg/dL (ref 0.0–40.0)

## 2012-11-15 LAB — HEPATIC FUNCTION PANEL
AST: 27 U/L (ref 0–37)
Alkaline Phosphatase: 50 U/L (ref 39–117)
Total Bilirubin: 1.3 mg/dL — ABNORMAL HIGH (ref 0.3–1.2)

## 2012-11-15 LAB — POCT URINALYSIS DIPSTICK
Blood, UA: NEGATIVE
Ketones, UA: NEGATIVE
Leukocytes, UA: NEGATIVE
Nitrite, UA: NEGATIVE
Protein, UA: NEGATIVE
pH, UA: 6.5

## 2012-11-15 LAB — PSA: PSA: 1.78 ng/mL (ref 0.10–4.00)

## 2012-11-20 NOTE — Progress Notes (Signed)
Quick Note:  Pt has CPE on 12/03/12 and will go over then. ______

## 2012-12-03 ENCOUNTER — Encounter: Payer: Self-pay | Admitting: Family Medicine

## 2012-12-03 ENCOUNTER — Ambulatory Visit (INDEPENDENT_AMBULATORY_CARE_PROVIDER_SITE_OTHER): Payer: 59 | Admitting: Family Medicine

## 2012-12-03 VITALS — BP 120/84 | HR 70 | Temp 98.2°F | Ht 69.5 in | Wt 202.0 lb

## 2012-12-03 DIAGNOSIS — Z0000 Encounter for general adult medical examination without abnormal findings: Secondary | ICD-10-CM

## 2012-12-03 MED ORDER — PANTOPRAZOLE SODIUM 40 MG PO TBEC: 40.0000 mg | DELAYED_RELEASE_TABLET | Freq: Every day | ORAL | Status: DC

## 2012-12-03 MED ORDER — LISINOPRIL 10 MG PO TABS: 10.0000 mg | ORAL_TABLET | Freq: Every day | ORAL | Status: DC

## 2012-12-04 ENCOUNTER — Encounter: Payer: Self-pay | Admitting: Family Medicine

## 2012-12-04 NOTE — Progress Notes (Signed)
  Subjective:    Patient ID: Joe Molina, male    DOB: May 17, 1956, 57 y.o.   MRN: [id number]  HPI 57 yr old male for a cpx. He feels well and has no concerns.    Review of Systems  Constitutional: Negative.   HENT: Negative.   Eyes: Negative.   Respiratory: Negative.   Cardiovascular: Negative.   Gastrointestinal: Negative.   Genitourinary: Negative.   Musculoskeletal: Negative.   Skin: Negative.   Neurological: Negative.   Hematological: Negative.   Psychiatric/Behavioral: Negative.        Objective:   Physical Exam  Constitutional: He is oriented to person, place, and time. He appears well-developed and well-nourished. No distress.  HENT:  Head: Normocephalic and atraumatic.  Right Ear: External ear normal.  Left Ear: External ear normal.  Nose: Nose normal.  Mouth/Throat: Oropharynx is clear and moist. No oropharyngeal exudate.  Eyes: Conjunctivae normal and EOM are normal. Pupils are equal, round, and reactive to light. Right eye exhibits no discharge. Left eye exhibits no discharge. No scleral icterus.  Neck: Neck supple. No JVD present. No tracheal deviation present. No thyromegaly present.  Cardiovascular: Normal rate, regular rhythm, normal heart sounds and intact distal pulses.  Exam reveals no gallop and no friction rub.   No murmur heard. Pulmonary/Chest: Effort normal and breath sounds normal. No respiratory distress. He has no wheezes. He has no rales. He exhibits no tenderness.  Abdominal: Soft. Bowel sounds are normal. He exhibits no distension and no mass. There is no tenderness. There is no rebound and no guarding.  Genitourinary: Rectum normal, prostate normal and penis normal. Guaiac negative stool. No penile tenderness.  Musculoskeletal: Normal range of motion. He exhibits no edema and no tenderness.  Lymphadenopathy:    He has no cervical adenopathy.  Neurological: He is alert and oriented to person, place, and time. He has normal reflexes. No cranial  nerve deficit. He exhibits normal muscle tone. Coordination normal.  Skin: Skin is warm and dry. No rash noted. He is not diaphoretic. No erythema. No pallor.  Psychiatric: He has a normal mood and affect. His behavior is normal. Judgment and thought content normal.          Assessment & Plan:  Well exam. We discussed diet and exercise.

## 2012-12-29 ENCOUNTER — Other Ambulatory Visit: Payer: Self-pay

## 2013-02-04 ENCOUNTER — Emergency Department (HOSPITAL_BASED_OUTPATIENT_CLINIC_OR_DEPARTMENT_OTHER)
Admission: EM | Admit: 2013-02-04 | Discharge: 2013-02-04 | Disposition: A | Discharge status: Home / Self Care | Payer: 59 | Attending: Emergency Medicine | Admitting: Emergency Medicine

## 2013-02-04 ENCOUNTER — Encounter (HOSPITAL_BASED_OUTPATIENT_CLINIC_OR_DEPARTMENT_OTHER): Payer: Self-pay | Admitting: *Deleted

## 2013-02-04 DIAGNOSIS — S39012A Strain of muscle, fascia and tendon of lower back, initial encounter: Secondary | ICD-10-CM

## 2013-02-04 DIAGNOSIS — S335XXA Sprain of ligaments of lumbar spine, initial encounter: Secondary | ICD-10-CM | POA: Insufficient documentation

## 2013-02-04 DIAGNOSIS — Z79899 Other long term (current) drug therapy: Secondary | ICD-10-CM | POA: Insufficient documentation

## 2013-02-04 DIAGNOSIS — Y929 Unspecified place or not applicable: Secondary | ICD-10-CM | POA: Insufficient documentation

## 2013-02-04 DIAGNOSIS — T733XXA Exhaustion due to excessive exertion, initial encounter: Secondary | ICD-10-CM | POA: Insufficient documentation

## 2013-02-04 DIAGNOSIS — K219 Gastro-esophageal reflux disease without esophagitis: Secondary | ICD-10-CM | POA: Insufficient documentation

## 2013-02-04 DIAGNOSIS — I10 Essential (primary) hypertension: Secondary | ICD-10-CM | POA: Insufficient documentation

## 2013-02-04 DIAGNOSIS — Y9389 Activity, other specified: Secondary | ICD-10-CM | POA: Insufficient documentation

## 2013-02-04 DIAGNOSIS — E785 Hyperlipidemia, unspecified: Secondary | ICD-10-CM | POA: Insufficient documentation

## 2013-02-04 MED ORDER — OXYCODONE-ACETAMINOPHEN 5-325 MG PO TABS: 1.0000 | ORAL_TABLET | ORAL | Status: DC | PRN

## 2013-02-04 MED ORDER — ORPHENADRINE CITRATE ER 100 MG PO TB12: 100.0000 mg | ORAL_TABLET | Freq: Two times a day (BID) | ORAL | Status: DC

## 2013-02-04 MED ORDER — IBUPROFEN 800 MG PO TABS
800.0000 mg | ORAL_TABLET | Freq: Once | ORAL | Status: AC
  Administered 2013-02-04: 800 mg via ORAL
  Filled 2013-02-04: qty 1

## 2013-02-04 MED ORDER — OXYCODONE-ACETAMINOPHEN 5-325 MG PO TABS
1.0000 | ORAL_TABLET | Freq: Once | ORAL | Status: AC
  Administered 2013-02-04: 1 via ORAL
  Filled 2013-02-04 (×2): qty 1

## 2013-02-04 MED ORDER — CYCLOBENZAPRINE HCL 10 MG PO TABS
10.0000 mg | ORAL_TABLET | Freq: Once | ORAL | Status: AC
  Administered 2013-02-04: 10 mg via ORAL
  Filled 2013-02-04: qty 1

## 2013-02-04 MED ORDER — NAPROXEN 500 MG PO TABS: 500.0000 mg | ORAL_TABLET | Freq: Two times a day (BID) | ORAL | Status: DC

## 2013-02-04 NOTE — ED Notes (Signed)
Pt to room 2 in w/c, able to stand and walk to bed with cg @. Pt reports he was riding his lawn mower on Saturday for approx one hour, "and when I stopped the mower I could barely get off of it and walk..." pt reports ongoing back pain since then.

## 2013-02-04 NOTE — ED Provider Notes (Signed)
History     CSN: [id number]  Arrival date & time 02/04/13  0740   First MD Initiated Contact with Patient 02/04/13 [phone]      Chief Complaint  Patient presents with  . Back Pain    (Consider location/radiation/quality/duration/timing/severity/associated sxs/prior treatment) Patient is a 57 y.o. male presenting with back pain. The history is provided by the patient.  Back Pain He had been on his riding lawnmower for about 1.5 hours 2 days ago. When he tried to get off, he noted that he was having severe lower back pain. There is no radiation of pain. He denies any weakness, numbness, tingling. Denies any difficulty with bowels or bladder. He took some aspirin and he did feel a little bit better yesterday but he was much worse when he woke up today. He rates his pain at 7/10. It is worse with movement and better if she stays still. He has had back pain in the past but this is more severe than previous episodes. Past Medical History  Diagnosis Date  . Hypertension   . Hyperlipidemia   . GERD (gastroesophageal reflux disease)     Past Surgical History  Procedure Laterality Date  . Cholecystectomy  06//06    Dr. Orson Slick  . Colonscopy  10-19-10    per Dr. Duard Larsen polyps, repeat in 3 years    Family History  Problem Relation Age of Onset  . Heart disease      male 1st degree relative at 58 yo    History  Substance Use Topics  . Smoking status: Never Smoker   . Smokeless tobacco: Never Used  . Alcohol Use: 3.5 oz/week    7 drink(s) per week      Review of Systems  Musculoskeletal: Positive for back pain.  All other systems reviewed and are negative.    Allergies  Codeine sulfate  Home Medications   Current Outpatient Rx  Name  Route  Sig  Dispense  Refill  . lisinopril (PRINIVIL,ZESTRIL) 10 MG tablet   Oral   Take 1 tablet (10 mg total) by mouth daily.   90 tablet   3   . Multiple Vitamin (MULTIVITAMIN) tablet   Oral   Take 1 tablet by mouth  daily. 50 plus         . Omega-3 Fatty Acids (FISH OIL PO)   Oral   Take 1 tablet by mouth daily.           . pantoprazole (PROTONIX) 40 MG tablet   Oral   Take 1 tablet (40 mg total) by mouth daily.   90 tablet   3   . Red Yeast Rice 600 MG CAPS   Oral   Take 1 capsule by mouth daily.             BP 163/88  Pulse 65  Temp(Src) 97.3 F (36.3 C) (Oral)  Resp 16  SpO2 100%  Physical Exam  Nursing note and vitals reviewed.  57 year old male, resting comfortably and in no acute distress. Vital signs are  significant for hypertension with blood pressure 163/88. Oxygen saturation is 100%, which is normal. Head is normocephalic and atraumatic. PERRLA, EOMI. Oropharynx is clear. Neck is nontender and supple without adenopathy or JVD. Back is nontender and there is no CVA tenderness. there is moderate bilateral paralumbar spasm. Straight leg raise is negative. Lungs are clear without rales, wheezes, or rhonchi. Chest is nontender. Heart has regular rate and rhythm without murmur. Abdomen is soft, flat,  nontender without masses or hepatosplenomegaly and peristalsis is normoactive. Extremities have no cyanosis or edema, full range of motion is present. Skin is warm and dry without rash. Neurologic: Mental status is normal, cranial nerves are intact, there are no motor or sensory deficits.  ED Course  Procedures (including critical care time)   1. Lumbar strain, initial encounter       MDM   lumbosacral strain. I reviewed his past history and find no imaging of his spine specifically, but he did have a CT scan of abdomen and pelvis in 2006 with no mention of any abnormalities of the lumbar spine. He is advised to use ice and he prescriptions are given for naproxen, orphenadrine, and Percocet.        Dione Booze, MD 02/04/13 [phone]

## 2013-02-11 ENCOUNTER — Telehealth: Payer: Self-pay | Admitting: Family Medicine

## 2013-02-11 NOTE — Telephone Encounter (Signed)
Patient Information:  Caller Name: Myrle  Phone: [phone]  Patient: Joe Molina, Joe Molina  Gender: Male  DOB: 11/26/1955  Age: 57 Years  PCP: Gershon Crane [facility])  Office Follow Up:  Does the office need to follow up with this patient?: No  Instructions For The Office: N/A   Symptoms  Reason For Call & Symptoms: Pt is calling and staes taht he started with diarrhea on  02/08/13; diarrhea x2 on 02/10/13; diarrhea x2 so far today; having infrequent diarrhea per pt; no fever; no pain; no vomiting;  Reviewed Health History In EMR: Yes  Reviewed Medications In EMR: Yes  Reviewed Allergies In EMR: Yes  Reviewed Surgeries / Procedures: Yes  Date of Onset of Symptoms: 02/08/2013  Guideline(s) Used:  Diarrhea  Disposition Per Guideline:   Home Care  Reason For Disposition Reached:   Mild diarrhea  Advice Given:  Fluids:  Drink more fluids, at least 8-10 glasses (8 oz or 240 ml) daily.  For example: sports drinks, diluted fruit juices, soft drinks.  Supplement this with saltine crackers or soups to make certain that you are getting sufficient fluid and salt to meet your body's needs.  Nutrition:  Ideal initial foods include boiled starches/cereals (e.g., potatoes, rice, noodles, wheat, oats) with a small amount of salt to taste.  Diarrhea Medication - Bismuth Subsalicylate (e.g., Kaopectate, PeptoBismol):  Adult dosage: 2 tablets or 2 tablespoons (30 ml) by mouth every hour if diarrhea continues to a maximum of 8 doses in a 24-hour period.  Expected Course:  Viral diarrhea lasts 4-7 days. Always worse on days 1 and 2.  Call Back If:  Signs of dehydration occur (e.g., no urine for more than 12 hours, very dry mouth, lightheaded, etc.)  Diarrhea lasts over 7 days  You become worse.  Patient Will Follow Care Advice:  YES

## 2013-02-11 NOTE — Telephone Encounter (Signed)
Noted  

## 2013-09-19 ENCOUNTER — Other Ambulatory Visit: Payer: Self-pay

## 2013-10-12 ENCOUNTER — Encounter: Payer: Self-pay | Admitting: Internal Medicine

## 2013-10-14 ENCOUNTER — Encounter: Payer: Self-pay | Admitting: Internal Medicine

## 2013-10-25 ENCOUNTER — Telehealth: Payer: Self-pay | Admitting: Family Medicine

## 2013-10-25 NOTE — Telephone Encounter (Signed)
Per Dr. Clent Ridges, okay to schedule for Monday 10/28/13.

## 2013-10-25 NOTE — Telephone Encounter (Signed)
done

## 2013-10-25 NOTE — Telephone Encounter (Signed)
Pt has ongoing pain in stomach and periodic burning on sides of upper stomach. Pt has had reflux before. Pt woul dlike appt to see dr fry. Nothing except SD pls advise

## 2013-10-28 ENCOUNTER — Ambulatory Visit (INDEPENDENT_AMBULATORY_CARE_PROVIDER_SITE_OTHER): Payer: 59 | Admitting: Family Medicine

## 2013-10-28 ENCOUNTER — Encounter: Payer: Self-pay | Admitting: Family Medicine

## 2013-10-28 VITALS — BP 154/100 | HR 115 | Temp 101.7°F | Wt 212.0 lb

## 2013-10-28 DIAGNOSIS — J209 Acute bronchitis, unspecified: Secondary | ICD-10-CM

## 2013-10-28 MED ORDER — HYDROCODONE-HOMATROPINE 5-1.5 MG/5ML PO SYRP: 5.0000 mL | ORAL_SOLUTION | ORAL | Status: DC | PRN

## 2013-10-28 MED ORDER — AZITHROMYCIN 250 MG PO TABS: ORAL_TABLET | ORAL | Status: DC

## 2013-10-28 NOTE — Progress Notes (Addendum)
   Subjective:    Patient ID: Joe Molina, male    DOB: 04-05-1956, 57 y.o.   MRN: [id number]  HPI Here for the sudden onset of fever, a dry cough, and body aches 2 days ago. No NVD.    Review of Systems  Constitutional: Positive for fever.  HENT: Positive for congestion. Negative for sinus pressure.   Eyes: Negative.   Respiratory: Positive for cough.        Objective:   Physical Exam  Constitutional:  Ill appearing   HENT:  Right Ear: External ear normal.  Left Ear: External ear normal.  Nose: Nose normal.  Mouth/Throat: Oropharynx is clear and moist.  Eyes: Conjunctivae are normal.  Pulmonary/Chest: Effort normal and breath sounds normal. No respiratory distress. He has no wheezes. He has no rales.  Lymphadenopathy:    He has no cervical adenopathy.          Assessment & Plan:  Bronchitis, probably secondary to influenza. Given a Zpack. Drink fluids   Pre visit review using our clinic review tool, if applicable. No additional management support is needed unless otherwise documented below in the visit note.

## 2013-10-30 ENCOUNTER — Telehealth: Payer: Self-pay | Admitting: Family Medicine

## 2013-10-30 NOTE — Telephone Encounter (Signed)
Please advise 

## 2013-10-30 NOTE — Telephone Encounter (Signed)
Patient Information:  Caller Name: Zoran  Phone: [phone]  Patient: Joe Molina, Joe Molina  Gender: Male  DOB: 1956/07/24  Age: 57 Years  PCP: Gershon Crane [facility])  Office Follow Up:  Does the office need to follow up with this patient?: Yes  Instructions For The Office: Please review.  Would like inhaler called in to try. If called today 12/17 then to use [facility] [address]. If called tomorrow 12/18 then [facility]. Can reach patient at [phone], please.  RN Note:  Patient declines to go to office, or make Appt today 12/15.  Wanted to speak with Dr Clent Ridges or Dr Cato Mulligan.  Requesting inhaler to see if it might help the shortness of breath.  Symptoms  Reason For Call & Symptoms: Has had difficulty breathing since Sat 12/13.  Saw Dr Clent Ridges in office on Mon 12/15 and started ZPak.  Cough and congestion same, fever 101 this am and fever controlled with Aspirin.  Most concern is his shortness of breath, difficulty breathing when moving about.  Just came from mailbox and can hear his SOB over phone.  Reviewed Health History In EMR: Yes  Reviewed Medications In EMR: Yes  Reviewed Allergies In EMR: Yes  Reviewed Surgeries / Procedures: Yes  Date of Onset of Symptoms: 10/26/2013  Guideline(s) Used:  Breathing Difficulty  Disposition Per Guideline:   Go to Office Now  Reason For Disposition Reached:   Mild difficulty breathing (e.g., minimal/no SOB at rest, SOB with walking, pulse < 100) of new onset or worse than normal  Advice Given:  N/A  Patient Refused Recommendation:  Patient Requests Prescription  Would like to try Inhaler to see if would help his SOB.

## 2013-10-30 NOTE — Telephone Encounter (Signed)
Spoke with patient.  Still has some fever but low grade.  Cough mostly dry.  On Z-max.  No nausea or vomiting. Has some dyspnea with activity but no obvious wheezing.  Discussed with patient we may need to look at CXR-especially if fever and  Dyspnea not improving by tomorrow.

## 2013-10-31 NOTE — Telephone Encounter (Signed)
noted 

## 2013-11-01 ENCOUNTER — Encounter (HOSPITAL_COMMUNITY): Payer: Self-pay | Admitting: Emergency Medicine

## 2013-11-01 ENCOUNTER — Emergency Department (HOSPITAL_COMMUNITY): Payer: 59

## 2013-11-01 ENCOUNTER — Inpatient Hospital Stay (HOSPITAL_COMMUNITY)
Admission: EM | Admit: 2013-11-01 | Discharge: 2013-11-06 | DRG: 853 | Disposition: A | Discharge status: Home / Self Care | Payer: 59 | Attending: Pulmonary Disease | Admitting: Pulmonary Disease

## 2013-11-01 DIAGNOSIS — I82409 Acute embolism and thrombosis of unspecified deep veins of unspecified lower extremity: Secondary | ICD-10-CM

## 2013-11-01 DIAGNOSIS — A419 Sepsis, unspecified organism: Principal | ICD-10-CM

## 2013-11-01 DIAGNOSIS — Z6830 Body mass index (BMI) 30.0-30.9, adult: Secondary | ICD-10-CM

## 2013-11-01 DIAGNOSIS — J189 Pneumonia, unspecified organism: Secondary | ICD-10-CM | POA: Diagnosis present

## 2013-11-01 DIAGNOSIS — E871 Hypo-osmolality and hyponatremia: Secondary | ICD-10-CM | POA: Diagnosis present

## 2013-11-01 DIAGNOSIS — K219 Gastro-esophageal reflux disease without esophagitis: Secondary | ICD-10-CM | POA: Diagnosis present

## 2013-11-01 DIAGNOSIS — M79609 Pain in unspecified limb: Secondary | ICD-10-CM

## 2013-11-01 DIAGNOSIS — I10 Essential (primary) hypertension: Secondary | ICD-10-CM | POA: Diagnosis present

## 2013-11-01 DIAGNOSIS — J690 Pneumonitis due to inhalation of food and vomit: Secondary | ICD-10-CM | POA: Diagnosis present

## 2013-11-01 DIAGNOSIS — R0902 Hypoxemia: Secondary | ICD-10-CM

## 2013-11-01 DIAGNOSIS — Z8249 Family history of ischemic heart disease and other diseases of the circulatory system: Secondary | ICD-10-CM

## 2013-11-01 DIAGNOSIS — J9600 Acute respiratory failure, unspecified whether with hypoxia or hypercapnia: Secondary | ICD-10-CM | POA: Diagnosis present

## 2013-11-01 DIAGNOSIS — E873 Alkalosis: Secondary | ICD-10-CM | POA: Diagnosis present

## 2013-11-01 DIAGNOSIS — D649 Anemia, unspecified: Secondary | ICD-10-CM | POA: Diagnosis present

## 2013-11-01 DIAGNOSIS — E785 Hyperlipidemia, unspecified: Secondary | ICD-10-CM | POA: Diagnosis present

## 2013-11-01 DIAGNOSIS — I498 Other specified cardiac arrhythmias: Secondary | ICD-10-CM | POA: Diagnosis present

## 2013-11-01 DIAGNOSIS — Z86711 Personal history of pulmonary embolism: Secondary | ICD-10-CM

## 2013-11-01 DIAGNOSIS — Z9089 Acquired absence of other organs: Secondary | ICD-10-CM

## 2013-11-01 DIAGNOSIS — E8881 Metabolic syndrome: Secondary | ICD-10-CM | POA: Diagnosis present

## 2013-11-01 DIAGNOSIS — I2699 Other pulmonary embolism without acute cor pulmonale: Secondary | ICD-10-CM

## 2013-11-01 DIAGNOSIS — I82402 Acute embolism and thrombosis of unspecified deep veins of left lower extremity: Secondary | ICD-10-CM

## 2013-11-01 DIAGNOSIS — R894 Abnormal immunological findings in specimens from other organs, systems and tissues: Secondary | ICD-10-CM | POA: Diagnosis present

## 2013-11-01 DIAGNOSIS — J9601 Acute respiratory failure with hypoxia: Secondary | ICD-10-CM

## 2013-11-01 HISTORY — DX: Personal history of pulmonary embolism: Z86.711

## 2013-11-01 LAB — URINE MICROSCOPIC-ADD ON

## 2013-11-01 LAB — BASIC METABOLIC PANEL
BUN: 15 mg/dL (ref 6–23)
Calcium: 8.7 mg/dL (ref 8.4–10.5)
Chloride: 97 mEq/L (ref 96–112)
Creatinine, Ser: 0.86 mg/dL (ref 0.50–1.35)
GFR calc Af Amer: >90 mL/min (ref >90)

## 2013-11-01 LAB — EXPECTORATED SPUTUM ASSESSMENT W GRAM STAIN, RFLX TO RESP C

## 2013-11-01 LAB — CBC WITH DIFFERENTIAL/PLATELET
Basophils Absolute: 0 10*3/uL (ref 0.0–0.1)
Basophils Relative: 0 % (ref 0–1)
Eosinophils Absolute: 0.1 10*3/uL (ref 0.0–0.7)
Eosinophils Relative: 0 % (ref 0–5)
HCT: 36.6 % — ABNORMAL LOW (ref 39.0–52.0)
Hemoglobin: 13.2 g/dL (ref 13.0–17.0)
MCH: 33 pg (ref 26.0–34.0)
MCHC: 36.1 g/dL — ABNORMAL HIGH (ref 30.0–36.0)
MCV: 91.5 fL (ref 78.0–100.0)
Monocytes Absolute: 1 10*3/uL (ref 0.1–1.0)
Monocytes Relative: 9 % (ref 3–12)
RDW: 11.4 % — ABNORMAL LOW (ref 11.5–15.5)

## 2013-11-01 LAB — STREP PNEUMONIAE URINARY ANTIGEN: Strep Pneumo Urinary Antigen: NEGATIVE

## 2013-11-01 LAB — EXPECTORATED SPUTUM ASSESSMENT W REFEX TO RESP CULTURE: Special Requests: NORMAL

## 2013-11-01 LAB — URINALYSIS, ROUTINE W REFLEX MICROSCOPIC
Glucose, UA: NEGATIVE mg/dL
Hgb urine dipstick: NEGATIVE
Ketones, ur: 40 mg/dL — AB
Protein, ur: 100 mg/dL — AB
Urobilinogen, UA: 1 mg/dL (ref 0.0–1.0)

## 2013-11-01 LAB — BLOOD GAS, ARTERIAL
Acid-Base Excess: 0.2 mmol/L (ref 0.0–2.0)
Drawn by: 276051
O2 Content: 2 L/min
O2 Saturation: 95 %

## 2013-11-01 LAB — TROPONIN I: Troponin I: <0.3 ng/mL (ref <0.30)

## 2013-11-01 LAB — MRSA PCR SCREENING: MRSA by PCR: NEGATIVE

## 2013-11-01 MED ORDER — ACETAMINOPHEN 325 MG PO TABS
650.0000 mg | ORAL_TABLET | Freq: Once | ORAL | Status: AC
  Administered 2013-11-01: 650 mg via ORAL

## 2013-11-01 MED ORDER — CEFTRIAXONE SODIUM 1 G IJ SOLR
1.0000 g | INTRAMUSCULAR | Status: DC
  Filled 2013-11-01: qty 10

## 2013-11-01 MED ORDER — DEXTROSE 5 % IV SOLN: 500.0000 mg | Freq: Once | INTRAVENOUS | Status: DC

## 2013-11-01 MED ORDER — DEXTROSE 5 % IV SOLN
1.0000 g | Freq: Once | INTRAVENOUS | Status: AC
  Administered 2013-11-01: 1 g via INTRAVENOUS
  Filled 2013-11-01: qty 10

## 2013-11-01 MED ORDER — HYDROCOD POLST-CHLORPHEN POLST 10-8 MG/5ML PO LQCR
5.0000 mL | Freq: Two times a day (BID) | ORAL | Status: DC
  Administered 2013-11-01: 5 mL via ORAL
  Filled 2013-11-01: qty 5

## 2013-11-01 MED ORDER — SODIUM CHLORIDE 0.9 % IV SOLN
INTRAVENOUS | Status: DC
  Administered 2013-11-01: 125 mL/h via INTRAVENOUS
  Administered 2013-11-02 – 2013-11-05 (×5): via INTRAVENOUS

## 2013-11-01 MED ORDER — LEVOFLOXACIN IN D5W 750 MG/150ML IV SOLN: 750.0000 mg | INTRAVENOUS | Status: DC

## 2013-11-01 MED ORDER — SODIUM CHLORIDE 0.9 % IV SOLN: 250.0000 mL | INTRAVENOUS | Status: DC | PRN

## 2013-11-01 MED ORDER — GUAIFENESIN 200 MG PO TABS
200.0000 mg | ORAL_TABLET | ORAL | Status: DC | PRN
  Administered 2013-11-01: 200 mg via ORAL
  Filled 2013-11-01 (×2): qty 1

## 2013-11-01 MED ORDER — OSELTAMIVIR PHOSPHATE 75 MG PO CAPS
75.0000 mg | ORAL_CAPSULE | Freq: Once | ORAL | Status: AC
  Administered 2013-11-01: 75 mg via ORAL
  Filled 2013-11-01: qty 1

## 2013-11-01 MED ORDER — PANTOPRAZOLE SODIUM 40 MG IV SOLR
40.0000 mg | INTRAVENOUS | Status: DC
  Administered 2013-11-01 – 2013-11-05 (×5): 40 mg via INTRAVENOUS
  Filled 2013-11-01 (×6): qty 40

## 2013-11-01 MED ORDER — ACETAMINOPHEN 325 MG PO TABS
650.0000 mg | ORAL_TABLET | Freq: Once | ORAL | Status: DC
  Filled 2013-11-01: qty 2

## 2013-11-01 MED ORDER — BENZONATATE 100 MG PO CAPS
200.0000 mg | ORAL_CAPSULE | Freq: Three times a day (TID) | ORAL | Status: DC | PRN
  Administered 2013-11-01 – 2013-11-03 (×2): 200 mg via ORAL
  Filled 2013-11-01 (×2): qty 2

## 2013-11-01 MED ORDER — LEVOFLOXACIN IN D5W 750 MG/150ML IV SOLN
750.0000 mg | INTRAVENOUS | Status: DC
  Administered 2013-11-01 – 2013-11-02 (×2): 750 mg via INTRAVENOUS
  Filled 2013-11-01 (×3): qty 150

## 2013-11-01 MED ORDER — SODIUM CHLORIDE 0.9 % IV BOLUS (SEPSIS)
500.0000 mL | Freq: Once | INTRAVENOUS | Status: AC
  Administered 2013-11-01: 500 mL via INTRAVENOUS

## 2013-11-01 MED ORDER — HYDROCODONE-ACETAMINOPHEN 5-325 MG PO TABS
2.0000 | ORAL_TABLET | ORAL | Status: DC | PRN
  Administered 2013-11-01 – 2013-11-02 (×4): 2 via ORAL
  Filled 2013-11-01 (×4): qty 2

## 2013-11-01 MED ORDER — ALBUTEROL SULFATE (5 MG/ML) 0.5% IN NEBU
2.5000 mg | INHALATION_SOLUTION | RESPIRATORY_TRACT | Status: DC | PRN
  Filled 2013-11-01: qty 0.5

## 2013-11-01 MED ORDER — HEPARIN SODIUM (PORCINE) 5000 UNIT/ML IJ SOLN
5000.0000 [IU] | Freq: Three times a day (TID) | INTRAMUSCULAR | Status: DC
  Administered 2013-11-01 – 2013-11-02 (×3): 5000 [IU] via SUBCUTANEOUS
  Filled 2013-11-01 (×6): qty 1

## 2013-11-01 NOTE — ED Notes (Signed)
Pt reports SOB since last Saturday, and sts was put on Z pack, however no improvement. Last night pt sts he started coughing up blood.

## 2013-11-01 NOTE — Progress Notes (Signed)
CARE MANAGEMENT NOTE 11/01/2013  Patient:  Joe Molina, Joe Molina   Account Number:  [id number]  Date Initiated:  11/01/2013  Documentation initiated by:  Sharronda Schweers  Subjective/Objective Assessment:   failed outpt treatment with pcp for congestion and temp. Increased dyspnea and temp poss pna     Action/Plan:   home when stable   Anticipated DC Date:  11/04/2013   Anticipated DC Plan:  HOME/SELF CARE  In-house referral  NA      DC Planning Services  NA      PAC Choice  NA   Choice offered to / List presented to:  NA   DME arranged  NA      DME agency  NA     HH arranged  NA      HH agency  NA   Status of service:  In process, will continue to follow Medicare Important Message given?  NA - LOS <3 / Initial given by admissions (If response is "NO", the following Medicare IM given date fields will be blank) Date Medicare IM given:   Date Additional Medicare IM given:    Discharge Disposition:    Per UR Regulation:  Reviewed for med. necessity/level of care/duration of stay  If discussed at Long Length of Stay Meetings, dates discussed:    Comments:  [id number]/Joe Molina, BSN, [place] [phone] Chart Reviewed for discharge and hospital needs. Discharge needs at time of review:  None present will follow for needs. Review of patient progress due on [id number].

## 2013-11-01 NOTE — H&P (Signed)
Name: Joe Molina MRN: [id number] DOB: [birth year]    ADMISSION DATE:  11/01/2013  REFERRING MD :  Effie Shy  PRIMARY SERVICE:  Pulmonary   CHIEF COMPLAINT:  Cough/fever/dyspnea   BRIEF PATIENT DESCRIPTION:  57 year old male seen by PCP on 12/15 for what was felt to be bronchitis vs possible viral process/flu. Sent home on azithro, had progressive dyspnea, right sided pleuritic CP, and some streaky hemoptysis on 12/19. Presented to ER 12/19. Had RLL infiltrate, fever, leukocytosis. Admitted to PCCM service for CAP.   SIGNIFICANT EVENTS / STUDIES:  12/15: seen by PCP placed on ZPAK after 2 days of sudden onset, fever, cough and dyspnea.  12/19: Admitted to SDU   LINES / TUBES:   CULTURES: Sputum 12/19>>> U strep 12/19>>> U legionella 12/19>>> Influenza panel 12/19>>> Viral panel 12/19>>>  ANTIBIOTICS: levaquin 12/19>>> Rocephin 12/19>>>  HISTORY OF PRESENT ILLNESS:   57 year old male seen by PCP on 12/15  After sudden onset fever, chills, exertional dyspnea and severe but essentially non productive cough w/ associated pleuritic type CP of the right chest first noted on 12/13 when he got up to walk the dog. He reported no obvious sick exposures. Did have his influenza vaccine this year. He did have a severe reflux episode on 12/11, which awoke him from sleep with acid taste in mouth and burning in chest. At his PCPs he was started on azithro for what was felt to be bronchitis vs possible viral process/flu. Since 15th had progressive dyspnea, right sided pleuritic CP, and some streaky hemoptysis on 12/19. Presented to ER 12/19. Had RLL infiltrate, fever, leukocytosis. Admitted to PCCM service for CAP.   PAST MEDICAL HISTORY :  Past Medical History  Diagnosis Date  . Hypertension   . Hyperlipidemia   . GERD (gastroesophageal reflux disease)   . Personal history of colonic adenomas 10/19/2010   Past Surgical History  Procedure Laterality Date  . Cholecystectomy  06//06    Dr.  Orson Slick  . Colonscopy  10-19-10    per Dr. Duard Larsen polyps, repeat in 3 years   Prior to Admission medications   Medication Sig Start Date End Date Taking? Authorizing Provider  azithromycin (ZITHROMAX) 250 MG tablet As directed 10/28/13  Yes Nelwyn Salisbury, MD  HYDROcodone-homatropine (HYDROMET) 5-1.5 MG/5ML syrup Take 5 mLs by mouth every 4 (four) hours as needed for cough. 10/28/13  Yes Nelwyn Salisbury, MD  lisinopril (PRINIVIL,ZESTRIL) 10 MG tablet Take 1 tablet (10 mg total) by mouth daily. 12/03/12  Yes Nelwyn Salisbury, MD  Multiple Vitamin (MULTIVITAMIN) tablet Take 1 tablet by mouth daily. 50 plus   Yes Historical Provider, MD  Omega-3 Fatty Acids (FISH OIL PO) Take 1 tablet by mouth daily.     Yes Historical Provider, MD  pantoprazole (PROTONIX) 40 MG tablet Take 1 tablet (40 mg total) by mouth daily. 12/03/12  Yes Nelwyn Salisbury, MD  Red Yeast Rice 600 MG CAPS Take 1 capsule by mouth daily.     Yes Historical Provider, MD   Allergies  Allergen Reactions  . Codeine Sulfate     REACTION: nausea    FAMILY HISTORY:  Family History  Problem Relation Age of Onset  . Heart disease      male 1st degree relative at 58 yo   SOCIAL HISTORY:  reports that he has never smoked. He has never used smokeless tobacco. He reports that he drinks about 3.5 ounces of alcohol per week. He reports that he does  not use illicit drugs.  Review of Systems:   Bolds are positive  Constitutional: weight loss, gain, night sweats, Fevers, chills, fatigue .  HEENT: headaches, Sore throat, sneezing, nasal congestion, post nasal drip, Difficulty swallowing, Tooth/dental problems, visual complaints visual changes, ear ache CV:  chest pain, radiates: ,Orthopnea, PND, swelling in lower extremities, dizziness, palpitations, syncope.  GI  heartburn, indigestion, abdominal pain, nausea, vomiting, diarrhea, change in bowel habits, loss of appetite, bloody stools.  Resp: cough, productive: , hemoptysis started  12/18 pm 2-3 episodes, volume from table spoon to quarter cup yellow sputum with streaky hemoptysis, dyspnea, chest pain, pleuritic.  Skin: rash or itching or icterus GU: dysuria, change in color of urine, urgency or frequency. flank pain, hematuria  MS: joint pain or swelling. decreased range of motion  Psych: change in mood or affect. depression or anxiety.  Neuro: difficulty with speech, weakness, numbness, ataxia    SUBJECTIVE:  Sig right sided pleuritic CP  VITAL SIGNS: Temp:  [98.3 F (36.8 C)-99.3 F (37.4 C)] 98.3 F (36.8 C) (12/19 0852) Pulse Rate:  [103-120] 103 (12/19 0852) Resp:  [18-24] 18 (12/19 0852) BP: (123-156)/(89-101) 123/89 mmHg (12/19 0852) SpO2:  [91 %-96 %] 93 % (12/19 0852) 2 liters  PHYSICAL EXAMINATION: General:  Acutely ill appearing white male, some sig right sided pleuritic type CP  Neuro:  A&O, non-focal  HEENT:  ATNC, MMM, face flushed  Cardiovascular: rrr Lungs:  Right basilar rales posteriorly  Abdomen:  Soft, non-tender, + bowel sounds  Musculoskeletal:  Intact  Skin:  Trace LE edema    Recent Labs Lab 11/01/13 0745  NA 129*  K 3.8  CL 97  CO2 20  BUN 15  CREATININE 0.86  GLUCOSE 191*    Recent Labs Lab 11/01/13 0745  HGB 13.2  HCT 36.6*  WBC 11.8*  PLT 173   Dg Chest Port 1 View  11/01/2013   CLINICAL DATA:  Cough and fever, hemoptysis  EXAM: PORTABLE CHEST - 1 VIEW  COMPARISON:  None.  FINDINGS: Cardiac shadow is within normal limits. The left lung is clear. Right basilar infiltrate is noted laterally. No other focal abnormality is noted.  IMPRESSION: Right basilar infiltrate.   Electronically Signed   By: Alcide Clever M.D.   On: 11/01/2013 07:58    ASSESSMENT / PLAN:  RLL Pneumonia. Failed to respond to out-patient therapy. Most likely CAP. Alternative considerations include: Aspiration (after reflux event). Doubt this is influenza w/ such a focal dense consolidation.  Hemoptysis: in setting of PNA  Plan Admit to  SDU Send U strep, legionella antigen, sputum culture if able. Viral screening.  Think we are out side of window for tamiflu (and don't think this is influenza) Widen abx, add resp quinilone and rocephin  PRN tussionex Supplemental O2 PRN neb Flutter  F/u cxr am 12/20  Mild hyponatremia Likely volume depletion.  Plan: IV hydration w/ NS Repeat chemistry later today   HTN Plan Tele Hold antihypertensive for now.   Pulmonary [facility] Critical Care Medicine [facility] Pager: [phone]  11/01/2013, 8:57 AM  Attending:  I have seen and examined the patient with nurse practitioner/resident and agree with the note above.   This looks like strep pneumo community acquired pneumonia; I wonder about macrolide resistance Will follow up flu panel Treat with ceftriaxone/levaquin SDU monitoring  Yolonda Kida PCCM Pager: [phone] Cell: [phone] If no response, call [phone]

## 2013-11-01 NOTE — Progress Notes (Signed)
ANTIBIOTIC CONSULT NOTE - INITIAL  Pharmacy Consult for Ceftriaxone, Renal adjustments for antibiotics Indication: pneumonia  Allergies  Allergen Reactions  . Codeine Sulfate     REACTION: nausea    Patient Measurements:     Vital Signs: Temp: 98.3 F (36.8 C) (12/19 0852) Temp src: Oral (12/19 0852) BP: 123/89 mmHg (12/19 0852) Pulse Rate: 103 (12/19 0852) Intake/Output from previous day:   Intake/Output from this shift:    Labs:  Recent Labs  11/01/13 0745  WBC 11.8*  HGB 13.2  PLT 173  CREATININE 0.86   The CrCl is unknown because both a height and weight (above a minimum accepted value) are required for this calculation. No results found for this basename: VANCOTROUGH, VANCOPEAK, VANCORANDOM, GENTTROUGH, GENTPEAK, GENTRANDOM, TOBRATROUGH, TOBRAPEAK, TOBRARND, AMIKACINPEAK, AMIKACINTROU, AMIKACIN,  in the last 72 hours   Microbiology: No results found for this or any previous visit (from the past 720 hour(s)).  Medical History: Past Medical History  Diagnosis Date  . Hypertension   . Hyperlipidemia   . GERD (gastroesophageal reflux disease)   . Personal history of colonic adenomas 10/19/2010     Assessment: 89 yoM presents with cough/fever/dyspnea.  Patient was started on azithromycin PTA for bronchitis vs viral illness but presents with worsening symptoms, fever, and leukocytosis.  CXR shows RLL infiltrate.  Starting Ceftriaxone and Levaquin for CAP.  Received Ceftriaxone 1g in ED.   Currently afebrile  WBC 11.8  SCr 0.86, CrCl >100 ml/min  Goal of Therapy:  Doses adjusted per renal clearance, eradication of infection  Plan:  1.  Ceftriaxone 1g IV q24.  Next dose due tomorrow. 2.  No dose adjustments to Levaquin.  Continue Levaquin 750 mg IV q24h. 3.  Pharmacy will sign off but will continue to follow peripherally, adjusting doses as needed.  Clance Boll 11/01/2013,10:08 AM

## 2013-11-01 NOTE — ED Provider Notes (Signed)
CSN: [id number]     Arrival date & time 11/01/13  0865 History   First MD Initiated Contact with Patient 11/01/13 [phone]     Chief Complaint  Patient presents with  . Shortness of Breath   (Consider location/radiation/quality/duration/timing/severity/associated sxs/prior Treatment) HPI Comments: Joe Molina is a 57 y.o. male who presents for evaluation of cough and shortness of breath. He has been ill for 5 days with fever, cough, shortness of breath, and now has bloody sputum. He feels weak. He did not take an antipyretic this morning. He saw his PCP 3 days ago and was treated with Zithromax for a possible pneumonia. No known sick contacts. He has recently had problems with reflux and plans on having an endoscopy in 2 weeks. He was able to drive himself to the emergency department today. There are no other known modifying factors.   Patient is a 57 y.o. male presenting with shortness of breath. The history is provided by the patient.  Shortness of Breath   Past Medical History  Diagnosis Date  . Hypertension   . Hyperlipidemia   . GERD (gastroesophageal reflux disease)   . Personal history of colonic adenomas 10/19/2010   Past Surgical History  Procedure Laterality Date  . Cholecystectomy  06//06    Dr. Orson Slick  . Colonscopy  10-19-10    per Dr. Duard Larsen polyps, repeat in 3 years   Family History  Problem Relation Age of Onset  . Heart disease      male 1st degree relative at 58 yo   History  Substance Use Topics  . Smoking status: Never Smoker   . Smokeless tobacco: Never Used  . Alcohol Use: 3.5 oz/week    7 drink(s) per week    Review of Systems  Respiratory: Positive for shortness of breath.   All other systems reviewed and are negative.    Allergies  Codeine sulfate  Home Medications   Current Outpatient Rx  Name  Route  Sig  Dispense  Refill  . azithromycin (ZITHROMAX) 250 MG tablet      As directed   6 tablet   0   .  HYDROcodone-homatropine (HYDROMET) 5-1.5 MG/5ML syrup   Oral   Take 5 mLs by mouth every 4 (four) hours as needed for cough.   240 mL   0   . lisinopril (PRINIVIL,ZESTRIL) 10 MG tablet   Oral   Take 1 tablet (10 mg total) by mouth daily.   90 tablet   3   . Multiple Vitamin (MULTIVITAMIN) tablet   Oral   Take 1 tablet by mouth daily. 50 plus         . Omega-3 Fatty Acids (FISH OIL PO)   Oral   Take 1 tablet by mouth daily.           . pantoprazole (PROTONIX) 40 MG tablet   Oral   Take 1 tablet (40 mg total) by mouth daily.   90 tablet   3   . Red Yeast Rice 600 MG CAPS   Oral   Take 1 capsule by mouth daily.            BP 156/101  Pulse 120  Temp(Src) 99.3 F (37.4 C) (Oral)  Resp 24  SpO2 96% Physical Exam  Nursing note and vitals reviewed. Constitutional: He is oriented to person, place, and time. He appears well-developed and well-nourished. He appears distressed (appears uncomfortable).  HENT:  Head: Normocephalic and atraumatic.  Right Ear: External ear normal.  Left Ear: External ear normal.  Eyes: Conjunctivae and EOM are normal. Pupils are equal, round, and reactive to light.  Neck: Normal range of motion and phonation normal. Neck supple.  Cardiovascular: Normal rate, regular rhythm, normal heart sounds and intact distal pulses.   Pulmonary/Chest: Effort normal and breath sounds normal. No respiratory distress. He has no wheezes. He has no rales. He exhibits no bony tenderness.  Abdominal: Soft. Normal appearance. There is no tenderness.  Musculoskeletal: Normal range of motion.  Neurological: He is alert and oriented to person, place, and time. No cranial nerve deficit or sensory deficit. He exhibits normal muscle tone. Coordination normal.  Skin: Skin is warm, dry and intact.  Psychiatric: His behavior is normal. Judgment and thought content normal.  Anxious    ED Course  Procedures (including critical care time)  Initial evaluation is  mostly consistent with influenza. No audible pneumonia. On examination. Moderately tachypneic and tachycardic. No chronic illnesses or demographics to place him in a high risk, for Influenza. His illnesses is subacute, however, secondary to his, distress. We'll initiate a dose of Tamiflu; as we proceed with the evaluation.  Medications  acetaminophen (TYLENOL) tablet 650 mg (not administered)  0.9 %  sodium chloride infusion (not administered)  sodium chloride 0.9 % bolus 500 mL (not administered)  cefTRIAXone (ROCEPHIN) 1 g in dextrose 5 % 50 mL IVPB (not administered)  azithromycin (ZITHROMAX) 500 mg in dextrose 5 % 250 mL IVPB (not administered)  acetaminophen (TYLENOL) tablet 650 mg (650 mg Oral Given 11/01/13 0730)  oseltamivir (TAMIFLU) capsule 75 mg (75 mg Oral Given 11/01/13 0803)   Patient Vitals for the past 24 hrs:  BP Temp Temp src Pulse Resp SpO2  11/01/13 0721 156/101 mmHg 99.3 F (37.4 C) Oral 120 24 96 %  11/01/13 0719 - - - - - 91 %    0753- PCXR reviewed by me is c/w RLL infiltrate. Will start IV ABX for CAP  8:13 AM-Consult complete with  Dr. Kendrick Fries- Intensivist   . Patient case explained and discussed. He agrees to admit patient for further evaluation and treatment. Call ended at 0820  CRITICAL CARE Performed by: Flint Melter Total critical care time: 35 minutes Critical care time was exclusive of separately billable procedures and treating other patients. Critical care was necessary to treat or prevent imminent or life-threatening deterioration. Critical care was time spent personally by me on the following activities: development of treatment plan with patient and/or surrogate as well as nursing, discussions with consultants, evaluation of patient's response to treatment, examination of patient, obtaining history from patient or surrogate, ordering and performing treatments and interventions, ordering and review of laboratory studies, ordering and review of  radiographic studies, pulse oximetry and re-evaluation of patient's condition.   Labs Review Labs Reviewed  CBC WITH DIFFERENTIAL - Abnormal; Notable for the following:    WBC 11.8 (*)    RBC 4.00 (*)    HCT 36.6 (*)    MCHC 36.1 (*)    RDW 11.4 (*)    Neutrophils Relative % 84 (*)    Neutro Abs 9.8 (*)    Lymphocytes Relative 7 (*)    All other components within normal limits  BLOOD GAS, ARTERIAL - Abnormal; Notable for the following:    pH, Arterial 7.532 (*)    pCO2 arterial 25.6 (*)    pO2, Arterial 68.1 (*)    All other components within normal limits  CULTURE, BLOOD (ROUTINE X 2)  CULTURE, BLOOD (ROUTINE X  2)  URINE CULTURE  BASIC METABOLIC PANEL  URINALYSIS, ROUTINE W REFLEX MICROSCOPIC  TROPONIN I  INFLUENZA PANEL BY PCR   Imaging Review Dg Chest Port 1 View  11/01/2013   CLINICAL DATA:  Cough and fever, hemoptysis  EXAM: PORTABLE CHEST - 1 VIEW  COMPARISON:  None.  FINDINGS: Cardiac shadow is within normal limits. The left lung is clear. Right basilar infiltrate is noted laterally. No other focal abnormality is noted.  IMPRESSION: Right basilar infiltrate.   Electronically Signed   By: Alcide Clever M.D.   On: 11/01/2013 07:58    EKG Interpretation    Date/Time:  Friday November 01 2013 07:21:33 EST Ventricular Rate:  123 PR Interval:  135 QRS Duration: 102 QT Interval:  335 QTC Calculation: 479 R Axis:   123 Text Interpretation:  Sinus tachycardia RSR' in V1 or V2, right VCD or RVH Borderline prolonged QT interval Baseline wander in lead(s) V2 Since last tracing rate faster Confirmed by Oris Calmes  MD, [facility] ([id number]) on 11/01/2013 7:25:54 AM            MDM   1. Community acquired pneumonia   2. Hypoxia      Community-acquired pneumonia failed outpatient treatment with Zithromax. Differential diagnosis includes influenza. Doubt PE, severe sepsis, or metabolic instability. He has increased work of breathing, and mild respiratory alkalosis. PO2 is low at  68 mmHg with a normal oxygen saturation 98% on 2 L nasal cannula oxygen.   Nursing Notes Reviewed/ Care Coordinated, and agree without changes. Applicable Imaging Reviewed.  Interpretation of Laboratory Data incorporated into ED treatment  Plan: Admit   Flint Melter, MD 11/01/13 [phone]

## 2013-11-02 ENCOUNTER — Encounter (HOSPITAL_COMMUNITY): Payer: Self-pay | Admitting: Radiology

## 2013-11-02 ENCOUNTER — Inpatient Hospital Stay (HOSPITAL_COMMUNITY): Payer: 59

## 2013-11-02 DIAGNOSIS — R609 Edema, unspecified: Secondary | ICD-10-CM

## 2013-11-02 DIAGNOSIS — I2699 Other pulmonary embolism without acute cor pulmonale: Secondary | ICD-10-CM

## 2013-11-02 DIAGNOSIS — K219 Gastro-esophageal reflux disease without esophagitis: Secondary | ICD-10-CM

## 2013-11-02 DIAGNOSIS — J9600 Acute respiratory failure, unspecified whether with hypoxia or hypercapnia: Secondary | ICD-10-CM

## 2013-11-02 DIAGNOSIS — J9601 Acute respiratory failure with hypoxia: Secondary | ICD-10-CM

## 2013-11-02 DIAGNOSIS — A419 Sepsis, unspecified organism: Secondary | ICD-10-CM

## 2013-11-02 LAB — PHOSPHORUS: Phosphorus: 1.6 mg/dL — ABNORMAL LOW (ref 2.3–4.6)

## 2013-11-02 LAB — HEPATIC FUNCTION PANEL
ALT: 39 U/L (ref 0–53)
AST: 31 U/L (ref 0–37)
Alkaline Phosphatase: 52 U/L (ref 39–117)
Bilirubin, Direct: 0.4 mg/dL — ABNORMAL HIGH (ref 0.0–0.3)
Indirect Bilirubin: 1.4 mg/dL — ABNORMAL HIGH (ref 0.3–0.9)
Total Bilirubin: 1.8 mg/dL — ABNORMAL HIGH (ref 0.3–1.2)
Total Protein: 6 g/dL (ref 6.0–8.3)

## 2013-11-02 LAB — CBC
Hemoglobin: 12.8 g/dL — ABNORMAL LOW (ref 13.0–17.0)
Hemoglobin: 11.7 g/dL — ABNORMAL LOW (ref 13.0–17.0)
MCHC: 34.6 g/dL (ref 30.0–36.0)
MCHC: 36.4 g/dL — ABNORMAL HIGH (ref 30.0–36.0)
MCV: 93.9 fL (ref 78.0–100.0)
Platelets: 173 10*3/uL (ref 150–400)
RDW: 11.4 % — ABNORMAL LOW (ref 11.5–15.5)
WBC: 13 10*3/uL — ABNORMAL HIGH (ref 4.0–10.5)

## 2013-11-02 LAB — LEGIONELLA ANTIGEN, URINE: Legionella Antigen, Urine: NEGATIVE

## 2013-11-02 LAB — GLUCOSE, CAPILLARY
Glucose-Capillary: 112 mg/dL — ABNORMAL HIGH (ref 70–99)
Glucose-Capillary: 118 mg/dL — ABNORMAL HIGH (ref 70–99)
Glucose-Capillary: 144 mg/dL — ABNORMAL HIGH (ref 70–99)

## 2013-11-02 LAB — URINE CULTURE: Special Requests: NORMAL

## 2013-11-02 LAB — BASIC METABOLIC PANEL
BUN: 12 mg/dL (ref 6–23)
BUN: 10 mg/dL (ref 6–23)
Calcium: 8 mg/dL — ABNORMAL LOW (ref 8.4–10.5)
Chloride: 97 mEq/L (ref 96–112)
Creatinine, Ser: 0.85 mg/dL (ref 0.50–1.35)
GFR calc Af Amer: >90 mL/min (ref >90)
GFR calc Af Amer: >90 mL/min (ref >90)
GFR calc non Af Amer: >90 mL/min (ref >90)
GFR calc non Af Amer: >90 mL/min (ref >90)
Glucose, Bld: 102 mg/dL — ABNORMAL HIGH (ref 70–99)
Potassium: 3.9 mEq/L (ref 3.5–5.1)
Sodium: 132 mEq/L — ABNORMAL LOW (ref 135–145)

## 2013-11-02 LAB — TROPONIN I: Troponin I: <0.3 ng/mL (ref <0.30)

## 2013-11-02 LAB — HEPARIN LEVEL (UNFRACTIONATED): Heparin Unfractionated: 0.23 IU/mL — ABNORMAL LOW (ref 0.30–0.70)

## 2013-11-02 LAB — ANTITHROMBIN III: AntiThromb III Func: 72 % — ABNORMAL LOW (ref 75–120)

## 2013-11-02 LAB — MAGNESIUM: Magnesium: 1.8 mg/dL (ref 1.5–2.5)

## 2013-11-02 MED ORDER — OSELTAMIVIR PHOSPHATE 75 MG PO CAPS
75.0000 mg | ORAL_CAPSULE | Freq: Two times a day (BID) | ORAL | Status: DC
  Administered 2013-11-02: 75 mg via ORAL
  Filled 2013-11-02 (×2): qty 1

## 2013-11-02 MED ORDER — ALBUTEROL SULFATE (5 MG/ML) 0.5% IN NEBU
2.5000 mg | INHALATION_SOLUTION | Freq: Four times a day (QID) | RESPIRATORY_TRACT | Status: DC
  Administered 2013-11-02 – 2013-11-04 (×8): 2.5 mg via RESPIRATORY_TRACT
  Filled 2013-11-02 (×7): qty 0.5

## 2013-11-02 MED ORDER — IOHEXOL 350 MG/ML SOLN
100.0000 mL | Freq: Once | INTRAVENOUS | Status: AC | PRN
  Administered 2013-11-02: 100 mL via INTRAVENOUS

## 2013-11-02 MED ORDER — HEPARIN (PORCINE) IN NACL 100-0.45 UNIT/ML-% IJ SOLN
1500.0000 [IU]/h | INTRAMUSCULAR | Status: DC
  Administered 2013-11-02: 1500 [IU]/h via INTRAVENOUS
  Filled 2013-11-02: qty 250

## 2013-11-02 MED ORDER — DEXTROSE 5 % IV SOLN
1.0000 g | Freq: Two times a day (BID) | INTRAVENOUS | Status: DC
  Filled 2013-11-02: qty 10

## 2013-11-02 MED ORDER — HYDRALAZINE HCL 20 MG/ML IJ SOLN
10.0000 mg | INTRAMUSCULAR | Status: DC | PRN
Start: 2013-11-02 — End: 2013-11-02
  Filled 2013-11-02: qty 1

## 2013-11-02 MED ORDER — FAMOTIDINE 20 MG PO TABS
20.0000 mg | ORAL_TABLET | Freq: Two times a day (BID) | ORAL | Status: DC
  Administered 2013-11-02 – 2013-11-04 (×6): 20 mg via ORAL
  Filled 2013-11-02 (×8): qty 1

## 2013-11-02 MED ORDER — INSULIN ASPART 100 UNIT/ML ~~LOC~~ SOLN
2.0000 [IU] | SUBCUTANEOUS | Status: DC
  Administered 2013-11-02 (×2): 2 [IU] via SUBCUTANEOUS

## 2013-11-02 MED ORDER — PIPERACILLIN-TAZOBACTAM 3.375 G IVPB
3.3750 g | Freq: Three times a day (TID) | INTRAVENOUS | Status: DC
  Administered 2013-11-02: 3.375 g via INTRAVENOUS
  Filled 2013-11-02 (×2): qty 50

## 2013-11-02 MED ORDER — HEPARIN (PORCINE) IN NACL 100-0.45 UNIT/ML-% IJ SOLN
1700.0000 [IU]/h | INTRAMUSCULAR | Status: DC
  Administered 2013-11-02 – 2013-11-03 (×3): 1700 [IU]/h via INTRAVENOUS
  Filled 2013-11-02 (×5): qty 250

## 2013-11-02 MED ORDER — FENTANYL CITRATE 0.05 MG/ML IJ SOLN
25.0000 ug | INTRAMUSCULAR | Status: DC | PRN
  Administered 2013-11-03 – 2013-11-04 (×5): 50 ug via INTRAVENOUS
  Administered 2013-11-05: 25 ug via INTRAVENOUS
  Filled 2013-11-02 (×6): qty 2

## 2013-11-02 MED ORDER — HYDROCOD POLST-CHLORPHEN POLST 10-8 MG/5ML PO LQCR
5.0000 mL | Freq: Two times a day (BID) | ORAL | Status: DC
  Administered 2013-11-02 – 2013-11-06 (×9): 5 mL via ORAL
  Filled 2013-11-02 (×9): qty 5

## 2013-11-02 MED ORDER — HEPARIN BOLUS VIA INFUSION
1000.0000 [IU] | Freq: Once | INTRAVENOUS | Status: AC
  Administered 2013-11-02: 1000 [IU] via INTRAVENOUS
  Filled 2013-11-02: qty 1000

## 2013-11-02 MED ORDER — ACETAMINOPHEN 10 MG/ML IV SOLN
1000.0000 mg | Freq: Once | INTRAVENOUS | Status: AC
  Filled 2013-11-02: qty 100

## 2013-11-02 NOTE — Progress Notes (Signed)
ANTICOAGULATION CONSULT NOTE - Initial Consult  Pharmacy Consult for Heparin Indication: pulmonary embolus  Allergies  Allergen Reactions  . Codeine Sulfate     REACTION: nausea    Patient Measurements: Height: 5\' 10"  (177.8 cm) Weight: 203 lb 11.3 oz (92.4 kg) IBW/kg (Calculated) : 73 Heparin Dosing Weight: 92 kg  Vital Signs: Temp: 101.2 F (38.4 C) (12/20 0455) Temp src: Axillary (12/20 0455) BP: 169/107 mmHg (12/20 0842) Pulse Rate: 112 (12/20 0842)  Labs:  Recent Labs  11/01/13 0745 11/02/13 0440 11/02/13 0841  HGB 13.2 12.8*  --   HCT 36.6* 37.0*  --   PLT 173 182  --   LABPROT  --   --  15.4*  INR  --   --  1.25  CREATININE 0.86 0.91  --   TROPONINI <0.30  --  <0.30    Estimated Creatinine Clearance: 101.1 ml/min (by C-G formula based on Cr of 0.91).   Medical History: Past Medical History  Diagnosis Date  . Hypertension   . Hyperlipidemia   . GERD (gastroesophageal reflux disease)   . Personal history of colonic adenomas 10/19/2010    Assessment: 59 yoM presents with worsening cough/fever/dyspnea following treatment with azithromycin outpatient.  Admitted 12/19 for treatment of pneumonia.  Today, respiratory symptoms worsening.  12/20 CT Angio positive for acute PE with right heart strain.  Beginning IV heparin.  Has been on SQ heparin since admission for VTE prophylaxis, last dose 0630 this AM.  Hgb 12.8, Platelets 182K  Baseline INR 1.25  SCr 0.91, CrCl>100 ml/min  Goal of Therapy:  Heparin level 0.3-0.7 units/ml Monitor platelets by anticoagulation protocol: Yes   Plan:  1.  Start IV heparin at 1500 units/hr (15 mL/hr).  No bolus considering proximity of last SQ heparin dose. 2.  Check heparin level 6 hours following start of infusion.  First level at ~1600 today. 3.  Daily HL and CBC while on IV heparin.  Clance Boll 11/02/2013,9:36 AM

## 2013-11-02 NOTE — Plan of Care (Signed)
Problem: Phase I Progression Outcomes Goal: OOB as tolerated unless otherwise ordered Outcome: Progressing Bedrest at this time.

## 2013-11-02 NOTE — Progress Notes (Addendum)
Name: Joe Molina MRN: [id number] DOB: [birth year]    ADMISSION DATE:  11/01/2013  REFERRING MD :  Effie Shy  PRIMARY SERVICE:  Pulmonary   CHIEF COMPLAINT:  Cough/fever/dyspnea   BRIEF PATIENT DESCRIPTION:   57 year old male seen by PCP on 12/15  After sudden onset fever, chills, exertional dyspnea and severe but essentially non productive cough w/ associated pleuritic type CP of the right chest first noted on 12/13 when he got up to walk the dog. He reported no obvious sick exposures. Did have his influenza vaccine this year. He did have a severe reflux episode on 12/11, which awoke him from sleep with acid taste in mouth and burning in chest. At his PCPs he was started on azithro for what was felt to be bronchitis vs possible viral process/flu. Since 15th had progressive dyspnea, right sided pleuritic CP, and some streaky hemoptysis on 12/19. Presented to ER 12/19. Had RLL infiltrate, fever, leukocytosis. Admitted to PCCM service for CAP.    SIGNIFICANT EVENTS / STUDIES:  12/15: seen by PCP placed on ZPAK after 2 days of sudden onset, fever, cough and dyspnea.  12/19: Admitted to SDU   LINES / TUBES:   CULTURES: Sputum 12/19>> neg U strep 12/19>>>neg U legionella 12/19>>> Influenza panel 12/19>>>neg Viral panel 12/19>>> PENDING MRSA PCR 12/19/1 4 - NEG    ANTIBIOTICS: levaquin 12/19>>> Rocephin 12/19>>>12/20 tamiflu 11/02/13 >> ( 5 days)    SUBJECTIVE/OVERNIGHT/INTERVAL HX 11/02/13 - Coughs easy. Now desaturating easy even on 2L. Needing 4L. Going to bathroom makes him desaturate/tachycardia. Febrile to 103F. Thehy give travel hx to [place] by car around thanskgiving 2014. Also severe acid reflxu for several days mid dec 2014 before falling ill. Currently worsening per RN    VITAL SIGNS: Temp:  [98.1 F (36.7 C)-102.9 F (39.4 C)] 101.2 F (38.4 C) (12/20 0455) Pulse Rate:  [89-109] 109 (12/20 0455) Resp:  [13-33] 33 (12/20 0455) BP: (118-166)/(84-103) 159/99  mmHg (12/20 0455) SpO2:  [91 %-99 %] 95 % (12/20 0455) Weight:  [92.4 kg (203 lb 11.3 oz)] 92.4 kg (203 lb 11.3 oz) (12/19 1027) 2 liters  PHYSICAL EXAMINATION: General:  Acutely ill appearing white male, coughing a lot Neuro:  A&O, non-focal  . Moves all 4s. CAM-ICU neg for delirium HEENT:  ATNC, MMM, face flushed  Cardiovascular: rrr Lungs:  Right basilar rales posteriorly . RR 30s atleast. Not paradoxicaly.  Sentences halted by cough Abdomen:  Soft, non-tender, + bowel sounds  Musculoskeletal:  Intact  Skin:  Trace LE edema    PULMONARY  Recent Labs Lab 11/01/13 0800  PHART 7.532*  PCO2ART 25.6*  PO2ART 68.1*  HCO3 21.4  TCO2 18.6  O2SAT 95.0    CBC  Recent Labs Lab 11/01/13 0745 11/02/13 0440  HGB 13.2 12.8*  HCT 36.6* 37.0*  WBC 11.8* 12.7*  PLT 173 182    COAGULATION No results found for this basename: INR,  in the last 168 hours  CARDIAC   Recent Labs Lab 11/01/13 0745  TROPONINI <0.30   No results found for this basename: PROBNP,  in the last 168 hours   CHEMISTRY  Recent Labs Lab 11/01/13 0745 11/02/13 0440  NA 129* 132*  K 3.8 3.9  CL 97 97  CO2 20 22  GLUCOSE 191* 102*  BUN 15 12  CREATININE 0.86 0.91  CALCIUM 8.7 8.3*   Estimated Creatinine Clearance: 101.1 ml/min (by C-G formula based on Cr of 0.91).   LIVER No results found for this  basename: AST, ALT, ALKPHOS, BILITOT, PROT, ALBUMIN, INR,  in the last 168 hours   INFECTIOUS  Recent Labs Lab 11/01/13 0745 11/02/13 0500  PROCALCITON 0.11 0.30     ENDOCRINE CBG (last 3)  No results found for this basename: GLUCAP,  in the last 72 hours       IMAGING x48h  Dg Chest Port 1 View  11/02/2013   CLINICAL DATA:  Followup right-sided pneumonia  EXAM: PORTABLE CHEST - 1 VIEW  COMPARISON:  11/01/2013  FINDINGS: Hazy infiltrate at the right base appear similar in extent. Given history of fever and cough, this is most likely a pneumonia -rather than infarct. There is  evidence of a small right pleural effusion. No new opacity. Borderline cardiomegaly, no edema.  IMPRESSION: New small pleural effusion around the persistent right base infiltrate.   Electronically Signed   By: Tiburcio Pea M.D.   On: 11/02/2013 06:12   Dg Chest Port 1 View  11/01/2013   CLINICAL DATA:  Cough and fever, hemoptysis  EXAM: PORTABLE CHEST - 1 VIEW  COMPARISON:  None.  FINDINGS: Cardiac shadow is within normal limits. The left lung is clear. Right basilar infiltrate is noted laterally. No other focal abnormality is noted.  IMPRESSION: Right basilar infiltrate.   Electronically Signed   By: Alcide Clever M.D.   On: 11/01/2013 07:58       ASSESSMENT / PLAN:  PULMONARY A:Acute resp failure to RLL infectiious process - aspiration v CAP without specific etiology. Worsening o2 need 12/20. SEver cough P:   Increase cough control - tussionex, fentanyl prn Titrate o2; bipap if needed Alb neb for cough  CT chest rule out empyema    CARDIOVASCULAR A: Sinus tach and hypertension P:  Hydralazine prn Cycle enzymes  RENAL A:  Mild hyponatremia P:   fluids  GASTROINTESTINAL A:  Poor appetite. Severe acid reflyux for days preceding illness. Wife feels this is trigger for current illness P:   NPO except clears and meds Protonix IV Pepcid po   HEMATOLOGIC A:  Anemia of critical illness P:  - PRBC for hgb </= 6.9gm%    - exceptions are   -  if ACS susepcted/confirmed then transfuse for hgb </= 8.0gm%,  or    -   active bleeding with hemodynamic instability, then transfuse regardless of hemoglobin value   At at all times try to transfuse 1 unit prbc as possible with exception of active hemorrhage - Heparin SQ bid for dvt proph   INFECTIOUS A:  RLL CAP NOS v Aspon.  Recent Zpak Sepsis  11/02/13 0- febrile 103F, Possilbe new effusion. AT risk for MDR in setting of recent Zpak  P:   Add tamiflu (PCR can be false negative a week out of symptoms esp if swab is nose +  seasonal flu shot might not have been effective + current outbreak in community +) Change ceftirazone to zosyn contninue levaquin for now Check lactiate Monitor PCT CT chest rule out empyema ID consult if needed   ENDOCRINE A:  Metabolic syndrome P:   Phase1 hyperglycemia  NEUROLOGIC A:  INtact but with pleuritic pain P:   fent prn  TODAY'S SUMMARY:  11/02/13 _ patient and wife extensively updated. Will change abx. Will get CT.  Control symptoms. Depending on above, low threshold for transfer to [facility] 2100 ICU. He is worse than 24h ago   The patient is critically ill with multiple organ systems failure and requires high complexity decision making for assessment  and support, frequent evaluation and titration of therapies, application of advanced monitoring technologies and extensive interpretation of multiple databases.   Critical Care Time devoted to patient care services described in this note is  45  Minutes.  Dr. Kalman Shan, M.D., [facility].C.P Pulmonary and Critical Care Medicine Staff Physician [facility]monary and Critical [facility] Pager: [phone], If no answer or between  15:00h - 7:00h: call [phone]  11/02/2013 8:56 AM

## 2013-11-02 NOTE — Progress Notes (Addendum)
ANTIBIOTIC CONSULT NOTE - INITIAL  Pharmacy Consult for Zosyn Indication: pneumonia  Allergies  Allergen Reactions  . Codeine Sulfate     REACTION: nausea    Patient Measurements: Height: 5\' 10"  (177.8 cm) Weight: 203 lb 11.3 oz (92.4 kg) IBW/kg (Calculated) : 73   Vital Signs: Temp: 101.2 F (38.4 C) (12/20 0455) Temp src: Axillary (12/20 0455) BP: 159/99 mmHg (12/20 0455) Pulse Rate: 109 (12/20 0455) Intake/Output from previous day: 12/19 0701 - 12/20 0700 In: 3890 [P.O.:740; I.V.:3000; IV Piggyback:150] Out: 1000 [Urine:1000] Intake/Output from this shift: Total I/O In: 125 [I.V.:125] Out: -   Labs:  Recent Labs  11/01/13 0745 11/02/13 0440  WBC 11.8* 12.7*  HGB 13.2 12.8*  PLT 173 182  CREATININE 0.86 0.91   Estimated Creatinine Clearance: 101.1 ml/min (by C-G formula based on Cr of 0.91). No results found for this basename: VANCOTROUGH, Leodis Binet, VANCORANDOM, GENTTROUGH, GENTPEAK, GENTRANDOM, TOBRATROUGH, TOBRAPEAK, TOBRARND, AMIKACINPEAK, AMIKACINTROU, AMIKACIN,  in the last 72 hours   Microbiology: Recent Results (from the past 720 hour(s))  MRSA PCR SCREENING     Status: None   Collection Time    11/01/13 10:27 AM      Result Value Range Status   MRSA by PCR NEGATIVE  NEGATIVE Final   Comment:            The GeneXpert MRSA Assay (FDA     approved for NASAL specimens     only), is one component of a     comprehensive MRSA colonization     surveillance program. It is not     intended to diagnose MRSA     infection nor to guide or     monitor treatment for     MRSA infections.  CULTURE, EXPECTORATED SPUTUM-ASSESSMENT     Status: None   Collection Time    11/01/13 10:31 AM      Result Value Range Status   Specimen Description SPUTUM   Final   Special Requests Normal   Final   Sputum evaluation     Final   Value: MICROSCOPIC FINDINGS SUGGEST THAT THIS SPECIMEN IS NOT REPRESENTATIVE OF LOWER RESPIRATORY SECRETIONS. PLEASE RECOLLECT.     CALLED  BOLE AT 1115 12.19.14 BY TIBBITTS   Report Status 11/01/2013 FINAL   Final    Medical History: Past Medical History  Diagnosis Date  . Hypertension   . Hyperlipidemia   . GERD (gastroesophageal reflux disease)   . Personal history of colonic adenomas 10/19/2010     Assessment: 77 yoM presents with cough/fever/dyspnea.  Patient was started on azithromycin PTA for bronchitis vs viral illness but presents with worsening symptoms, fever, and leukocytosis.  CXR shows RLL infiltrate.  Started Ceftriaxone and Levaquin for CAP on admission.  Febrile with Tmax of 103 today.  Broadening therapy to Zosyn and Levaquin.  MD concerned for MDR organism and possible new effusion.  Pt had severe acid reflux for several days in mid December before falling ill.  Also, restarting Tamiflu for 5 day course.   WBC 12.7, increasing  SCr 0.91, CrCl >100 ml/min  12/19 Blood and urine cultures ordered yesterday.  Sputum culture did not represent lower respiratory secretions.  12/19 Legionella urinary antigen: in process  12/19 Strep pneumoniae urinary antigen: negative  12/19 Respiratory virus panel: in process  12/19 Influenza panel by PCR: negative  PCT increased today: 0.11 >> 0.30.  F/u PCT in AM to see if this improves with antibiotic changes made today.  Goal of Therapy:  Doses adjusted per renal clearance, eradication of infection  Plan:   Zosyn 3.375g IV q8h (4 hour infusion time).  F/u culture results and clinical course.   Clance Boll 11/02/2013,9:02 AM

## 2013-11-02 NOTE — Progress Notes (Signed)
ANTICOAGULATION CONSULT NOTE - Initial Consult  Pharmacy Consult for Heparin Indication: pulmonary embolus  Allergies  Allergen Reactions  . Codeine Sulfate     REACTION: nausea    Patient Measurements: Height: 5\' 10"  (177.8 cm) Weight: 219 lb 5.7 oz (99.5 kg) IBW/kg (Calculated) : 73 Heparin Dosing Weight: 92 kg  Vital Signs: Temp: 98.6 F (37 C) (12/20 1600) Temp src: Oral (12/20 1600) BP: 182/106 mmHg (12/20 1600) Pulse Rate: 113 (12/20 1600)  Labs:  Recent Labs  11/01/13 0745 11/02/13 0440 11/02/13 0841 11/02/13 1700  HGB 13.2 12.8*  --  11.7*  HCT 36.6* 37.0*  --  32.1*  PLT 173 182  --  173  LABPROT  --   --  15.4*  --   INR  --   --  1.25  --   HEPARINUNFRC  --   --   --  0.23*  CREATININE 0.86 0.91  --   --   TROPONINI <0.30  --  <0.30  --     Estimated Creatinine Clearance: 104.6 ml/min (by C-G formula based on Cr of 0.91).   Medical History: Past Medical History  Diagnosis Date  . Hypertension   . Hyperlipidemia   . GERD (gastroesophageal reflux disease)   . Personal history of colonic adenomas 10/19/2010    Assessment: 5 yoM presents with worsening cough/fever/dyspnea following treatment with azithromycin outpatient.  Admitted 12/19 for treatment of pneumonia.  Today, respiratory symptoms worsening.  12/20 CT Angio positive for acute PE with right heart strain.  Beginning IV heparin.  Has been on SQ heparin since admission for VTE prophylaxis, last dose 0630 this AM.  Hgb 12.8, Platelets 182K  Baseline INR 1.25  SCr 0.91, CrCl>100 ml/min  1st heparin level = 0.23 subtherapeutic.  No issues per RN.   Goal of Therapy:  Heparin level 0.3-0.7 units/ml Monitor platelets by anticoagulation protocol: Yes   Plan:  1.  Bolus IV heparin 1000 units x 1, increase rate of IV heparin to 1700 units/hr (17 mL/hr).   2.  Check heparin level 6 hours following start of infusion.  3.  Daily HL and CBC while on IV heparin.  Tiffanny Lamarche  E 11/02/2013,5:37 PM

## 2013-11-02 NOTE — Progress Notes (Addendum)
Ct Angio Chest Pe W/cm &/or Wo Cm  11/02/2013   CLINICAL DATA:  Increasing shortness of breast.  Fever.  EXAM: CT ANGIOGRAPHY CHEST WITH CONTRAST  TECHNIQUE: Multidetector CT imaging of the chest was performed using the standard protocol during bolus administration of intravenous contrast. Multiplanar CT image reconstructions including MIPs were obtained to evaluate the vascular anatomy.  CONTRAST:  OMNIPAQUE IOHEXOL 350 MG/ML SOLN  COMPARISON:  CHEST x-ray 11/02/2013  FINDINGS: There are large central pulmonary emboli with extension into both upper and lower lobes and likely into the right middle lobe as well. The largest clot burden is in the lower lobes bilaterally, right greater than left. Associated airspace disease within the right lower lobe with small right pleural effusion. This could be related to early pulmonary infarct/hemorrhage. There is evidence of right heart strain with elevated RV:LV ratio of 1.8 (normal is less than 0.9). Heart is mildly enlarged. Aorta is normal caliber. No mediastinal, hilar, or axillary adenopathy. Chest wall soft tissues are unremarkable. Imaging into the upper abdomen shows no acute findings.  No acute bony abnormality. Sclerotic focus within a mid thoracic vertebral body likely reflects a bone island.  Review of the MIP images confirms the above findings.  IMPRESSION: Positive for acute PE with CT evidence of right heart strain (RV/LV Ratio = 1.8) consistent with at least submassive (intermediate risk)PE. The presence of right heart strain has been associated with an increased risk of morbidity and mortality. Consultation with Pulmonary and Critical Care Medicine is recommended.  Airspace disease within the right lower lung could reflect pulmonary infarcts/hemorrhage. Small right pleural effusion.  Critical Value/emergent results were called by telephone at the time of interpretation on 11/02/2013 at 9:35 AM to Dr. Kalman Shan , who verbally acknowledged these  results.   Electronically Signed   By: Charlett Nose M.D.   On: 11/02/2013 09:35   Dg Chest Port 1 View  11/02/2013   CLINICAL DATA:  Followup right-sided pneumonia  EXAM: PORTABLE CHEST - 1 VIEW  COMPARISON:  11/01/2013  FINDINGS: Hazy infiltrate at the right base appear similar in extent. Given history of fever and cough, this is most likely a pneumonia -rather than infarct. There is evidence of a small right pleural effusion. No new opacity. Borderline cardiomegaly, no edema.  IMPRESSION: New small pleural effusion around the persistent right base infiltrate.   Electronically Signed   By: Tiburcio Pea M.D.   On: 11/02/2013 06:12   Dg Chest Port 1 View  11/01/2013   CLINICAL DATA:  Cough and fever, hemoptysis  EXAM: PORTABLE CHEST - 1 VIEW  COMPARISON:  None.  FINDINGS: Cardiac shadow is within normal limits. The left lung is clear. Right basilar infiltrate is noted laterally. No other focal abnormality is noted.  IMPRESSION: Right basilar infiltrate.   Electronically Signed   By: Alcide Clever M.D.   On: 11/01/2013 07:58     Dx of SUBMASSIVE PE d/w patient and wife BP good  Options of Rx and risks discussed  IVheparin - main stay Cath directed lytic - new oprogram at [facility]. No patient done yet. Zero local experience Systemic tPA - no diff 30 day outcome but can make him feel better sooner. 5% risk of bleed  He prefers IV heparin to start   PLAN IV heparin Oral Rx to be discussed later Dc tamiflu Dc zosyn Check echo and duplex LE Stat hypercoag panel befoe IV heparin (already had sq heparin) OPD fu Dr Cyndie Chime   Dr.  Kalman Shan, M.D., F.C.C.P Pulmonary and Critical Care Medicine Staff Physician [facility] Pulmonary and Critical Care Pager: [phone], If no answer or between  15:00h - 7:00h: call [phone]  11/02/2013 10:11 AM

## 2013-11-02 NOTE — H&P (Signed)
Joe Molina is an 57 y.o. male.   Chief Complaint: PE HPI: He was found to have submassive PE today by CT after being treated for Bronchitis last week. He is hemodynamically stable other than sinus tachycradia. He complains of chest pain and is short of breath. He has had very minimal hemoptysis and has no other source of bleeding. CT chest shows BLL PE and an RV/LV ratio of 1.8.  Past Medical History  Diagnosis Date  . Hypertension   . Hyperlipidemia   . GERD (gastroesophageal reflux disease)   . Personal history of colonic adenomas 10/19/2010    Past Surgical History  Procedure Laterality Date  . Cholecystectomy  06//06    Dr. Orson Slick  . Colonscopy  10-19-10    per Dr. Duard Larsen polyps, repeat in 3 years    Family History  Problem Relation Age of Onset  . Heart disease      male 1st degree relative at 57 yo   Social History:  reports that he has never smoked. He has never used smokeless tobacco. He reports that he drinks about 3.5 ounces of alcohol per week. He reports that he does not use illicit drugs.  Allergies:  Allergies  Allergen Reactions  . Codeine Sulfate     REACTION: nausea    Medications Prior to Admission  Medication Sig Dispense Refill  . azithromycin (ZITHROMAX) 250 MG tablet As directed  6 tablet  0  . HYDROcodone-homatropine (HYDROMET) 5-1.5 MG/5ML syrup Take 5 mLs by mouth every 4 (four) hours as needed for cough.  240 mL  0  . lisinopril (PRINIVIL,ZESTRIL) 10 MG tablet Take 1 tablet (10 mg total) by mouth daily.  90 tablet  3  . Multiple Vitamin (MULTIVITAMIN) tablet Take 1 tablet by mouth daily. 50 plus      . Omega-3 Fatty Acids (FISH OIL PO) Take 1 tablet by mouth daily.        . pantoprazole (PROTONIX) 40 MG tablet Take 1 tablet (40 mg total) by mouth daily.  90 tablet  3  . Red Yeast Rice 600 MG CAPS Take 1 capsule by mouth daily.          Results for orders placed during the hospital encounter of 11/01/13 (from the past 48 hour(s))   CBC WITH DIFFERENTIAL     Status: Abnormal   Collection Time    11/01/13  7:45 AM      Result Value Range   WBC 11.8 (*) 4.0 - 10.5 K/uL   RBC 4.00 (*) 4.22 - 5.81 MIL/uL   Hemoglobin 13.2  13.0 - 17.0 g/dL   HCT 16.1 (*) 09.6 - 04.5 %   MCV 91.5  78.0 - 100.0 fL   MCH 33.0  26.0 - 34.0 pg   MCHC 36.1 (*) 30.0 - 36.0 g/dL   RDW 40.9 (*) 81.1 - 91.4 %   Platelets 173  150 - 400 K/uL   Neutrophils Relative % 84 (*) 43 - 77 %   Neutro Abs 9.8 (*) 1.7 - 7.7 K/uL   Lymphocytes Relative 7 (*) 12 - 46 %   Lymphs Abs 0.8  0.7 - 4.0 K/uL   Monocytes Relative 9  3 - 12 %   Monocytes Absolute 1.0  0.1 - 1.0 K/uL   Eosinophils Relative 0  0 - 5 %   Eosinophils Absolute 0.1  0.0 - 0.7 K/uL   Basophils Relative 0  0 - 1 %   Basophils Absolute 0.0  0.0 -  0.1 K/uL  BASIC METABOLIC PANEL     Status: Abnormal   Collection Time    11/01/13  7:45 AM      Result Value Range   Sodium 129 (*) 135 - 145 mEq/L   Potassium 3.8  3.5 - 5.1 mEq/L   Chloride 97  96 - 112 mEq/L   CO2 20  19 - 32 mEq/L   Glucose, Bld 191 (*) 70 - 99 mg/dL   BUN 15  6 - 23 mg/dL   Creatinine, Ser 4.54  0.50 - 1.35 mg/dL   Calcium 8.7  8.4 - 09.8 mg/dL   GFR calc non Af Amer >90  >90 mL/min   GFR calc Af Amer >90  >90 mL/min   Comment: (NOTE)     The eGFR has been calculated using the CKD EPI equation.     This calculation has not been validated in all clinical situations.     eGFR's persistently <90 mL/min signify possible Chronic Kidney     Disease.  CULTURE, BLOOD (ROUTINE X 2)     Status: None   Collection Time    11/01/13  7:45 AM      Result Value Range   Specimen Description BLOOD RIGHT HAND     Special Requests BOTTLES DRAWN AEROBIC AND ANAEROBIC 5 ML     Culture  Setup Time       Value: 11/01/2013 13:35     Performed at [facility]   Culture       Value:        BLOOD CULTURE RECEIVED NO GROWTH TO DATE CULTURE WILL BE HELD FOR 5 DAYS BEFORE ISSUING A FINAL NEGATIVE REPORT     Performed at  [facility]   Report Status PENDING    CULTURE, BLOOD (ROUTINE X 2)     Status: None   Collection Time    11/01/13  7:45 AM      Result Value Range   Specimen Description BLOOD LEFT ARM LABEL HAS 0845 COLLECT TIME     Special Requests BOTTLES DRAWN AEROBIC AND ANAEROBIC     Culture  Setup Time       Value: 11/01/2013 13:36     Performed at [facility]   Culture       Value:        BLOOD CULTURE RECEIVED NO GROWTH TO DATE CULTURE WILL BE HELD FOR 5 DAYS BEFORE ISSUING A FINAL NEGATIVE REPORT     Performed at [facility]   Report Status PENDING    TROPONIN I     Status: None   Collection Time    11/01/13  7:45 AM      Result Value Range   Troponin I <0.30  <0.30 ng/mL   Comment:            Due to the release kinetics of cTnI,     a negative result within the first hours     of the onset of symptoms does not rule out     myocardial infarction with certainty.     If myocardial infarction is still suspected,     repeat the test at appropriate intervals.  PROCALCITONIN     Status: None   Collection Time    11/01/13  7:45 AM      Result Value Range   Procalcitonin 0.11     Comment:            Interpretation:  PCT (Procalcitonin) <= 0.5 ng/mL:     Systemic infection (sepsis) is not likely.     Local bacterial infection is possible.     (NOTE)             ICU PCT Algorithm               Non ICU PCT Algorithm        ----------------------------     ------------------------------             PCT < 0.25 ng/mL                 PCT < 0.1 ng/mL         Stopping of antibiotics            Stopping of antibiotics           strongly encouraged.               strongly encouraged.        ----------------------------     ------------------------------           PCT level decrease by               PCT < 0.25 ng/mL           >= 80% from peak PCT           OR PCT 0.25 - 0.5 ng/mL          Stopping of antibiotics                                                  encouraged.         Stopping of antibiotics               encouraged.        ----------------------------     ------------------------------           PCT level decrease by              PCT >= 0.25 ng/mL           < 80% from peak PCT            AND PCT >= 0.5 ng/mL            Continuing antibiotics                                                  encouraged.           Continuing antibiotics                encouraged.        ----------------------------     ------------------------------         PCT level increase compared          PCT > 0.5 ng/mL             with peak PCT AND              PCT >= 0.5 ng/mL             Escalation of antibiotics  strongly encouraged.          Escalation of antibiotics            strongly encouraged.  BLOOD GAS, ARTERIAL     Status: Abnormal   Collection Time    11/01/13  8:00 AM      Result Value Range   O2 Content 2.0     Delivery systems NASAL CANNULA     pH, Arterial 7.532 (*) 7.350 - 7.450   pCO2 arterial 25.6 (*) 35.0 - 45.0 mmHg   pO2, Arterial 68.1 (*) 80.0 - 100.0 mmHg   Bicarbonate 21.4  20.0 - 24.0 mEq/L   TCO2 18.6  0 - 100 mmol/L   Acid-Base Excess 0.2  0.0 - 2.0 mmol/L   O2 Saturation 95.0     Patient temperature 98.6     Collection site LEFT RADIAL     Drawn by [id number]     Sample type ARTERIAL DRAW     Allens test (pass/fail) PASS  PASS  URINALYSIS, ROUTINE W REFLEX MICROSCOPIC     Status: Abnormal   Collection Time    11/01/13  8:06 AM      Result Value Range   Color, Urine AMBER (*) YELLOW   Comment: BIOCHEMICALS MAY BE AFFECTED BY COLOR   APPearance CLEAR  CLEAR   Specific Gravity, Urine 1.025  1.005 - 1.030   pH 6.0  5.0 - 8.0   Glucose, UA NEGATIVE  NEGATIVE mg/dL   Hgb urine dipstick NEGATIVE  NEGATIVE   Bilirubin Urine SMALL (*) NEGATIVE   Ketones, ur 40 (*) NEGATIVE mg/dL   Protein, ur 045 (*) NEGATIVE mg/dL   Urobilinogen, UA 1.0  0.0 - 1.0 mg/dL   Nitrite NEGATIVE   NEGATIVE   Leukocytes, UA TRACE (*) NEGATIVE  URINE CULTURE     Status: None   Collection Time    11/01/13  8:06 AM      Result Value Range   Specimen Description URINE, CLEAN CATCH     Special Requests Normal     Culture  Setup Time       Value: 11/01/2013 13:49     Performed at [facility] Count       Value: NO GROWTH     Performed at [facility]   Culture       Value: NO GROWTH     Performed at [facility]   Report Status 11/02/2013 FINAL    URINE MICROSCOPIC-ADD ON     Status: Abnormal   Collection Time    11/01/13  8:06 AM      Result Value Range   Squamous Epithelial / LPF RARE  RARE   WBC, UA 0-2  <3 WBC/hpf   Bacteria, UA FEW (*) RARE   Urine-Other MUCOUS PRESENT    INFLUENZA PANEL BY PCR     Status: None   Collection Time    11/01/13  8:32 AM      Result Value Range   Influenza A By PCR NEGATIVE  NEGATIVE   Influenza B By PCR NEGATIVE  NEGATIVE   H1N1 flu by pcr NOT DETECTED  NOT DETECTED   Comment:            The Xpert Flu assay (FDA approved for     nasal aspirates or washes and     nasopharyngeal swab specimens), is     intended as an aid in the diagnosis of     influenza  and should not be used as     a sole basis for treatment.     Performed at [facility]  MRSA PCR SCREENING     Status: None   Collection Time    11/01/13 10:27 AM      Result Value Range   MRSA by PCR NEGATIVE  NEGATIVE   Comment:            The GeneXpert MRSA Assay (FDA     approved for NASAL specimens     only), is one component of a     comprehensive MRSA colonization     surveillance program. It is not     intended to diagnose MRSA     infection nor to guide or     monitor treatment for     MRSA infections.  CULTURE, EXPECTORATED SPUTUM-ASSESSMENT     Status: None   Collection Time    11/01/13 10:31 AM      Result Value Range   Specimen Description SPUTUM     Special Requests Normal     Sputum evaluation       Value:  MICROSCOPIC FINDINGS SUGGEST THAT THIS SPECIMEN IS NOT REPRESENTATIVE OF LOWER RESPIRATORY SECRETIONS. PLEASE RECOLLECT.     CALLED BOLE AT [phone] BY TIBBITTS   Report Status 11/01/2013 FINAL    STREP PNEUMONIAE URINARY ANTIGEN     Status: None   Collection Time    11/01/13 11:41 AM      Result Value Range   Strep Pneumo Urinary Antigen NEGATIVE  NEGATIVE   Comment:            Infection due to S. pneumoniae     cannot be absolutely ruled out     since the antigen present     may be below the detection limit     of the test.     Performed at [facility]  LEGIONELLA ANTIGEN, URINE     Status: None   Collection Time    11/01/13 11:41 AM      Result Value Range   Specimen Description URINE, CLEAN CATCH     Special Requests NONE     Legionella Antigen, Urine       Value: Negative for Legionella pneumophilia serogroup 1     Performed at [facility]   Report Status 11/02/2013 FINAL    CBC     Status: Abnormal   Collection Time    11/02/13  4:40 AM      Result Value Range   WBC 12.7 (*) 4.0 - 10.5 K/uL   RBC 3.94 (*) 4.22 - 5.81 MIL/uL   Hemoglobin 12.8 (*) 13.0 - 17.0 g/dL   HCT 16.1 (*) 09.6 - 04.5 %   MCV 93.9  78.0 - 100.0 fL   MCH 32.5  26.0 - 34.0 pg   MCHC 34.6  30.0 - 36.0 g/dL   RDW 40.9 (*) 81.1 - 91.4 %   Platelets 182  150 - 400 K/uL  BASIC METABOLIC PANEL     Status: Abnormal   Collection Time    11/02/13  4:40 AM      Result Value Range   Sodium 132 (*) 135 - 145 mEq/L   Potassium 3.9  3.5 - 5.1 mEq/L   Chloride 97  96 - 112 mEq/L   CO2 22  19 - 32 mEq/L   Glucose, Bld 102 (*) 70 - 99 mg/dL   BUN 12  6 -  23 mg/dL   Creatinine, Ser 1.61  0.50 - 1.35 mg/dL   Calcium 8.3 (*) 8.4 - 10.5 mg/dL   GFR calc non Af Amer >90  >90 mL/min   GFR calc Af Amer >90  >90 mL/min   Comment: (NOTE)     The eGFR has been calculated using the CKD EPI equation.     This calculation has not been validated in all clinical situations.     eGFR's persistently  <90 mL/min signify possible Chronic Kidney     Disease.  PRO B NATRIURETIC PEPTIDE     Status: Abnormal   Collection Time    11/02/13  4:40 AM      Result Value Range   Pro B Natriuretic peptide (BNP) 2386.0 (*) 0 - 125 pg/mL  PROCALCITONIN     Status: None   Collection Time    11/02/13  5:00 AM      Result Value Range   Procalcitonin 0.30     Comment:            Interpretation:     PCT (Procalcitonin) <= 0.5 ng/mL:     Systemic infection (sepsis) is not likely.     Local bacterial infection is possible.     (NOTE)             ICU PCT Algorithm               Non ICU PCT Algorithm        ----------------------------     ------------------------------             PCT < 0.25 ng/mL                 PCT < 0.1 ng/mL         Stopping of antibiotics            Stopping of antibiotics           strongly encouraged.               strongly encouraged.        ----------------------------     ------------------------------           PCT level decrease by               PCT < 0.25 ng/mL           >= 80% from peak PCT           OR PCT 0.25 - 0.5 ng/mL          Stopping of antibiotics                                                 encouraged.         Stopping of antibiotics               encouraged.        ----------------------------     ------------------------------           PCT level decrease by              PCT >= 0.25 ng/mL           < 80% from peak PCT            AND PCT >= 0.5 ng/mL  Continuing antibiotics                                                  encouraged.           Continuing antibiotics                encouraged.        ----------------------------     ------------------------------         PCT level increase compared          PCT > 0.5 ng/mL             with peak PCT AND              PCT >= 0.5 ng/mL             Escalation of antibiotics                                              strongly encouraged.          Escalation of antibiotics            strongly  encouraged.  LACTIC ACID, PLASMA     Status: None   Collection Time    11/02/13  8:41 AM      Result Value Range   Lactic Acid, Venous 1.4  0.5 - 2.2 mmol/L  PROTIME-INR     Status: Abnormal   Collection Time    11/02/13  8:41 AM      Result Value Range   Prothrombin Time 15.4 (*) 11.6 - 15.2 seconds   INR 1.25  0.00 - 1.49  TROPONIN I     Status: None   Collection Time    11/02/13  8:41 AM      Result Value Range   Troponin I <0.30  <0.30 ng/mL   Comment:            Due to the release kinetics of cTnI,     a negative result within the first hours     of the onset of symptoms does not rule out     myocardial infarction with certainty.     If myocardial infarction is still suspected,     repeat the test at appropriate intervals.  HEPATIC FUNCTION PANEL     Status: Abnormal   Collection Time    11/02/13  8:41 AM      Result Value Range   Total Protein 6.0  6.0 - 8.3 g/dL   Albumin 2.7 (*) 3.5 - 5.2 g/dL   AST 31  0 - 37 U/L   ALT 39  0 - 53 U/L   Alkaline Phosphatase 52  39 - 117 U/L   Total Bilirubin 1.8 (*) 0.3 - 1.2 mg/dL   Bilirubin, Direct 0.4 (*) 0.0 - 0.3 mg/dL   Indirect Bilirubin 1.4 (*) 0.3 - 0.9 mg/dL  MAGNESIUM     Status: None   Collection Time    11/02/13  8:41 AM      Result Value Range   Magnesium 1.8  1.5 - 2.5 mg/dL  PHOSPHORUS     Status: Abnormal   Collection Time    11/02/13  8:41 AM      Result  Value Range   Phosphorus 1.6 (*) 2.3 - 4.6 mg/dL  ANTITHROMBIN III     Status: Abnormal   Collection Time    11/02/13 10:40 AM      Result Value Range   AntiThromb III Func 72 (*) 75 - 120 %   Comment: Performed at [facility]  GLUCOSE, CAPILLARY     Status: Abnormal   Collection Time    11/02/13 12:22 PM      Result Value Range   Glucose-Capillary 118 (*) 70 - 99 mg/dL   Comment 1 Documented in Chart     Comment 2 Notify RN     Ct Angio Chest Pe W/cm &/or Wo Cm  11/02/2013   CLINICAL DATA:  Increasing shortness of breast.  Fever.  EXAM:  CT ANGIOGRAPHY CHEST WITH CONTRAST  TECHNIQUE: Multidetector CT imaging of the chest was performed using the standard protocol during bolus administration of intravenous contrast. Multiplanar CT image reconstructions including MIPs were obtained to evaluate the vascular anatomy.  CONTRAST:  OMNIPAQUE IOHEXOL 350 MG/ML SOLN  COMPARISON:  CHEST x-ray 11/02/2013  FINDINGS: There are large central pulmonary emboli with extension into both upper and lower lobes and likely into the right middle lobe as well. The largest clot burden is in the lower lobes bilaterally, right greater than left. Associated airspace disease within the right lower lobe with small right pleural effusion. This could be related to early pulmonary infarct/hemorrhage. There is evidence of right heart strain with elevated RV:LV ratio of 1.8 (normal is less than 0.9). Heart is mildly enlarged. Aorta is normal caliber. No mediastinal, hilar, or axillary adenopathy. Chest wall soft tissues are unremarkable. Imaging into the upper abdomen shows no acute findings.  No acute bony abnormality. Sclerotic focus within a mid thoracic vertebral body likely reflects a bone island.  Review of the MIP images confirms the above findings.  IMPRESSION: Positive for acute PE with CT evidence of right heart strain (RV/LV Ratio = 1.8) consistent with at least submassive (intermediate risk)PE. The presence of right heart strain has been associated with an increased risk of morbidity and mortality. Consultation with Pulmonary and Critical Care Medicine is recommended.  Airspace disease within the right lower lung could reflect pulmonary infarcts/hemorrhage. Small right pleural effusion.  Critical Value/emergent results were called by telephone at the time of interpretation on 11/02/2013 at 9:35 AM to Dr. Kalman Shan , who verbally acknowledged these results.   Electronically Signed   By: Charlett Nose M.D.   On: 11/02/2013 09:35   Dg Chest Port 1  View  11/02/2013   CLINICAL DATA:  Followup right-sided pneumonia  EXAM: PORTABLE CHEST - 1 VIEW  COMPARISON:  11/01/2013  FINDINGS: Hazy infiltrate at the right base appear similar in extent. Given history of fever and cough, this is most likely a pneumonia -rather than infarct. There is evidence of a small right pleural effusion. No new opacity. Borderline cardiomegaly, no edema.  IMPRESSION: New small pleural effusion around the persistent right base infiltrate.   Electronically Signed   By: Tiburcio Pea M.D.   On: 11/02/2013 06:12   Dg Chest Port 1 View  11/01/2013   CLINICAL DATA:  Cough and fever, hemoptysis  EXAM: PORTABLE CHEST - 1 VIEW  COMPARISON:  None.  FINDINGS: Cardiac shadow is within normal limits. The left lung is clear. Right basilar infiltrate is noted laterally. No other focal abnormality is noted.  IMPRESSION: Right basilar infiltrate.   Electronically Signed   By:  Alcide Clever M.D.   On: 11/01/2013 07:58    ROS  Blood pressure 151/83, pulse 93, temperature 99.8 F (37.7 C), temperature source Axillary, resp. rate 30, height 5\' 10"  (1.778 m), weight 219 lb 5.7 oz (99.5 kg), SpO2 96.00%. Physical Exam  Constitutional: He is oriented to person, place, and time. He appears well-developed and well-nourished.  HENT:  Head: Normocephalic and atraumatic.  Neck: Normal range of motion. Neck supple.  Cardiovascular: Regular rhythm.   Sinus tach  Respiratory:  Tachypnea and somewhat labored breathing  Neurological: He is alert and oriented to person, place, and time.  Skin: Skin is warm and dry.     Assessment/Plan Submassive pulmonary embolism with intermediate mortality. PA EKOS lytic infusion has been shown to facilitate RV strain and is an option for treatment. Recommendations are for anticoagulation and re-evaluation in the AM. If he becomes unstable, will proceed with lysis.  Anderson Coppock,ART A 11/02/2013, 3:37 PM

## 2013-11-02 NOTE — Progress Notes (Signed)
*  Preliminary Results* Bilateral lower extremity venous duplex completed. The right lower extremity is negative for deep vein thrombosis.   The left lower extremity is positive for acute deep vein thrombosis involving the left popliteal, left posterior tibial,and left peroneal veins. The left common femoral vein demonstrates thrombus of indeterminate age that is poorly adhered to the vessel wall, suspicious for mobile thrombus however no mobility was obviously evident, and was difficult to determine due to strong arterial pulse. The proximal femoral and profunda femoral veins demonstrate thrombosis that appears to be subacute versus chronic. There is no evidence of Baker's cyst bilaterally.  There is no evidence of Baker's cyst bilaterally.  Preliminary results discussed with Velna Hatchet, RN.  11/02/2013  Gertie Fey, RVT, RDCS, RDMS

## 2013-11-03 DIAGNOSIS — I2699 Other pulmonary embolism without acute cor pulmonale: Secondary | ICD-10-CM

## 2013-11-03 DIAGNOSIS — I369 Nonrheumatic tricuspid valve disorder, unspecified: Secondary | ICD-10-CM

## 2013-11-03 HISTORY — DX: Other pulmonary embolism without acute cor pulmonale: I26.99

## 2013-11-03 LAB — CBC WITH DIFFERENTIAL/PLATELET
Eosinophils Absolute: 0.1 10*3/uL (ref 0.0–0.7)
HCT: 32.8 % — ABNORMAL LOW (ref 39.0–52.0)
Hemoglobin: 11.5 g/dL — ABNORMAL LOW (ref 13.0–17.0)
Lymphocytes Relative: 9 % — ABNORMAL LOW (ref 12–46)
Lymphs Abs: 1.1 10*3/uL (ref 0.7–4.0)
MCH: 32.2 pg (ref 26.0–34.0)
MCHC: 35.1 g/dL (ref 30.0–36.0)
MCV: 91.9 fL (ref 78.0–100.0)
Monocytes Absolute: 1.4 10*3/uL — ABNORMAL HIGH (ref 0.1–1.0)
Monocytes Relative: 11 % (ref 3–12)
Neutrophils Relative %: 79 % — ABNORMAL HIGH (ref 43–77)
RBC: 3.57 MIL/uL — ABNORMAL LOW (ref 4.22–5.81)
WBC: 12.2 10*3/uL — ABNORMAL HIGH (ref 4.0–10.5)

## 2013-11-03 LAB — RESPIRATORY VIRUS PANEL
Adenovirus: NOT DETECTED
Influenza A: NOT DETECTED
Influenza B: NOT DETECTED
Metapneumovirus: NOT DETECTED
Parainfluenza 1: NOT DETECTED
Parainfluenza 2: NOT DETECTED
Parainfluenza 3: NOT DETECTED
Respiratory Syncytial Virus B: NOT DETECTED
Rhinovirus: NOT DETECTED

## 2013-11-03 LAB — GLUCOSE, CAPILLARY: Glucose-Capillary: 111 mg/dL — ABNORMAL HIGH (ref 70–99)

## 2013-11-03 LAB — MAGNESIUM: Magnesium: 2 mg/dL (ref 1.5–2.5)

## 2013-11-03 MED ORDER — AMLODIPINE BESYLATE 10 MG PO TABS
10.0000 mg | ORAL_TABLET | Freq: Every day | ORAL | Status: DC
  Administered 2013-11-03 – 2013-11-06 (×4): 10 mg via ORAL
  Filled 2013-11-03 (×4): qty 1

## 2013-11-03 MED ORDER — ACETAMINOPHEN 325 MG PO TABS
650.0000 mg | ORAL_TABLET | ORAL | Status: DC | PRN
  Administered 2013-11-03: 650 mg via ORAL
  Filled 2013-11-03: qty 2

## 2013-11-03 MED ORDER — POTASSIUM PHOSPHATE DIBASIC 3 MMOLE/ML IV SOLN
10.0000 mmol | Freq: Once | INTRAVENOUS | Status: AC
  Administered 2013-11-03: 10 mmol via INTRAVENOUS
  Filled 2013-11-03: qty 3.33

## 2013-11-03 MED ORDER — AMLODIPINE BESYLATE 5 MG PO TABS
5.0000 mg | ORAL_TABLET | Freq: Every day | ORAL | Status: DC
  Filled 2013-11-03: qty 1

## 2013-11-03 MED ORDER — HYDRALAZINE HCL 20 MG/ML IJ SOLN
10.0000 mg | INTRAMUSCULAR | Status: DC | PRN
  Administered 2013-11-03 – 2013-11-04 (×3): 10 mg via INTRAVENOUS
  Filled 2013-11-03 (×3): qty 1

## 2013-11-03 MED ORDER — LEVOFLOXACIN 750 MG PO TABS
750.0000 mg | ORAL_TABLET | Freq: Every day | ORAL | Status: DC
  Administered 2013-11-03 – 2013-11-06 (×4): 750 mg via ORAL
  Filled 2013-11-03 (×4): qty 1

## 2013-11-03 NOTE — Progress Notes (Signed)
Elink consulted regarding the diastolic BP trending up. Takes 10mg  of Lisinopril daily at home. No new orders received.

## 2013-11-03 NOTE — Progress Notes (Signed)
  Echocardiogram 2D Echocardiogram has been performed.  Joe Molina 11/03/2013, 9:23 AM

## 2013-11-03 NOTE — Progress Notes (Signed)
ANTICOAGULATION CONSULT NOTE - Initial Consult  Pharmacy Consult for Heparin Indication: pulmonary embolus  Allergies  Allergen Reactions  . Codeine Sulfate     REACTION: nausea    Patient Measurements: Height: 5\' 10"  (177.8 cm) Weight: 215 lb 9.8 oz (97.8 kg) IBW/kg (Calculated) : 73 Heparin Dosing Weight: 92 kg  Vital Signs: Temp: 101.7 F (38.7 C) (12/21 0800) Temp src: Oral (12/21 0400) BP: 168/101 mmHg (12/21 0800)  Labs:  Recent Labs  11/01/13 0745 11/02/13 0440 11/02/13 0841 11/02/13 1700 11/02/13 2330 11/03/13 0402  HGB 13.2 12.8*  --  11.7*  --  11.5*  HCT 36.6* 37.0*  --  32.1*  --  32.8*  PLT 173 182  --  173  --  176  LABPROT  --   --  15.4*  --   --   --   INR  --   --  1.25  --   --   --   HEPARINUNFRC  --   --   --  0.23* 0.47 0.34  CREATININE 0.86 0.91  --  0.85  --   --   TROPONINI <0.30  --  <0.30  --   --   --     Estimated Creatinine Clearance: 111.1 ml/min (by C-G formula based on Cr of 0.85).   Medical History: Past Medical History  Diagnosis Date  . Hypertension   . Hyperlipidemia   . GERD (gastroesophageal reflux disease)   . Personal history of colonic adenomas 10/19/2010    Assessment: 66 yoM presents with worsening cough/fever/dyspnea following treatment with azithromycin outpatient.  Admitted 12/19 for treatment of pneumonia.  Today, respiratory symptoms worsening.  12/20 CT Angio positive for acute PE with right heart strain.  Dopplers show LLE positive for DVT.  Begin IV heparin 12/20.     Heparin level 0.34 - therapeutic with infusion rate at 1700 units/hr   No bleeding/complications reported. Hgb 11.5, Platelets 176K  Renal function stable - SCr 0.85, CrCl>100 ml/min  Goal of Therapy:  Heparin level 0.3-0.7 units/ml Monitor platelets by anticoagulation protocol: Yes   Plan:  1.  Continue IV heparin at 1700 units/hr (17 mL/hr).   2.  Daily HL and CBC while on IV heparin.  Clance Boll 11/03/2013,9:40 AM

## 2013-11-03 NOTE — Progress Notes (Signed)
ANTICOAGULATION CONSULT NOTE - FOLLOW UP  Pharmacy Consult for Heparin Indication: pulmonary embolus  Allergies  Allergen Reactions  . Codeine Sulfate     REACTION: nausea    Patient Measurements: Height: 5\' 10"  (177.8 cm) Weight: 219 lb 5.7 oz (99.5 kg) IBW/kg (Calculated) : 73 Heparin Dosing Weight: 92 kg  Vital Signs: Temp: 98.7 F (37.1 C) (12/21 0000) Temp src: Axillary (12/21 0000) BP: 132/102 mmHg (12/21 0000) Pulse Rate: 88 (12/20 2000)  Labs:  Recent Labs  11/01/13 0745 11/02/13 0440 11/02/13 0841 11/02/13 1700 11/02/13 2330  HGB 13.2 12.8*  --  11.7*  --   HCT 36.6* 37.0*  --  32.1*  --   PLT 173 182  --  173  --   LABPROT  --   --  15.4*  --   --   INR  --   --  1.25  --   --   HEPARINUNFRC  --   --   --  0.23* 0.47  CREATININE 0.86 0.91  --  0.85  --   TROPONINI <0.30  --  <0.30  --   --     Estimated Creatinine Clearance: 112 ml/min (by C-G formula based on Cr of 0.85).   Medical History: Past Medical History  Diagnosis Date  . Hypertension   . Hyperlipidemia   . GERD (gastroesophageal reflux disease)   . Personal history of colonic adenomas 10/19/2010    Assessment:  58 yoM presents with worsening cough/fever/dyspnea following treatment with azithromycin outpatient.  Admitted 12/19 for treatment of pneumonia.  On 12/20 respiratory symptoms worsening.  12/20 CT Angio positive for acute PE with right heart strain.  Dopplers show + LLE DVT.    Heparin level = 0.47 on heparin @ 1700 units/hr  No complications of therapy noted  Goal of Therapy:  Heparin level 0.3-0.7 units/ml Monitor platelets by anticoagulation protocol: Yes   Plan:  1.  Continue IV heparin @ 1700 units/hr (17 mL/hr).   2.  Daily HL and CBC while on IV heparin.  Jedd Schulenburg, Joselyn Glassman, PharmD 11/03/2013,12:30 AM

## 2013-11-03 NOTE — Progress Notes (Signed)
Upon entering the room, he was standing at the side of the bed to void. Oxygen sats were 86, dyspneic, ashen and diaphoretic.Assisted back to bed. Oxygen sats recovered to 95 . Informed that he will have to use the urinal without getting out of bed until his pulmonary status improves.

## 2013-11-03 NOTE — Progress Notes (Signed)
Name: Joe Molina MRN: [id number] DOB: Mar 20, 1956   PCP Nelwyn Salisbury, MD   ADMISSION DATE:  11/01/2013  REFERRING MD :  Effie Shy  PRIMARY SERVICE:  Pulmonary   CHIEF COMPLAINT:  Cough/fever/dyspnea   BRIEF PATIENT DESCRIPTION:   57 year old male seen by PCP on 12/15  After sudden onset fever, chills, exertional dyspnea and severe but essentially non productive cough w/ associated pleuritic type CP of the right chest first noted on 12/13 when he got up to walk the dog. He reported no obvious sick exposures. Did have his influenza vaccine this year. He did have a severe reflux episode on 12/11, which awoke him from sleep with acid taste in mouth and burning in chest. At his PCPs he was started on azithro for what was felt to be bronchitis vs possible viral process/flu. Since 15th had progressive dyspnea, right sided pleuritic CP, and some streaky hemoptysis on 12/19. Presented to ER 12/19. Had RLL infiltrate, fever, leukocytosis. Admitted to PCCM service for CAP.    SIGNIFICANT EVENTS / STUDIES:  12/15: seen by PCP placed on ZPAK after 2 days of sudden onset, fever, cough and dyspnea.  12/19: Admitted to SDU  12/20 - worse to 4L 02. Submasive PE with RV strain (normal trop and lactate but high bnp), hypertensive,  Large LLE DVT +. Rx IV heparin. IR consult - held off on cathether directed lysis.l Gives hx of  Several long car trip Fall 2014 including to [place], [place] [id number] 2014 followed by calf pain  LINES / TUBES:   CULTURES: Sputum 12/19>> neg U strep 12/19>>>neg U legionella 12/19>>> Influenza panel 12/19>>>neg Viral panel 12/19>>> PENDING MRSA PCR 12/19/1 4 - NEG    ANTIBIOTICS: levaquin 12/19>>> Rocephin 12/19>>>12/20     SUBJECTIVE/OVERNIGHT/INTERVAL HX  11/03/13 - very dyspneic with any activity. Overall feels stable and maybe cough better but feels has not turned corner. Pink tinged sputum + . Cough spells + but improved. Appetite good. Says mental fog better.  STill on 4L [place]. Fever still + 101F but not feeling it  VITAL SIGNS: Temp:  [98.6 F (37 C)-101.7 F (38.7 C)] 101.7 F (38.7 C) (12/21 0800) Pulse Rate:  [88-113] 88 (12/20 2000) Resp:  [19-45] 23 (12/21 0800) BP: (132-182)/(78-110) 168/101 mmHg (12/21 0800) SpO2:  [91 %-100 %] 95 % (12/21 0800) Weight:  [97.8 kg (215 lb 9.8 oz)-99.5 kg (219 lb 5.7 oz)] 97.8 kg (215 lb 9.8 oz) (12/21 0400) 2 liters  PHYSICAL EXAMINATION: General:  Acutely ill appearing white male, coughing  Less. Color looks better. Says good appetite Neuro:  A&O, non-focal  . Moves all 4s. CAM-ICU neg for delirium HEENT:  ATNC, MMM, face flushed  Cardiovascular: rrr Lungs:  Dyspneic talking on 4LNC and brings pulse down to 88%.  Right basilar rales posteriorly . RR 30s atleast. Not paradoxicaly.  Sentences halted by dyspnea Abdomen:  Soft, non-tender, + bowel sounds  Musculoskeletal:  Intact  Skin:  Trace LE edema    PULMONARY  Recent Labs Lab 11/01/13 0800  PHART 7.532*  PCO2ART 25.6*  PO2ART 68.1*  HCO3 21.4  TCO2 18.6  O2SAT 95.0    CBC  Recent Labs Lab 11/02/13 0440 11/02/13 1700 11/03/13 0402  HGB 12.8* 11.7* 11.5*  HCT 37.0* 32.1* 32.8*  WBC 12.7* 13.0* 12.2*  PLT 182 173 176    COAGULATION  Recent Labs Lab 11/02/13 0841  INR 1.25    CARDIAC    Recent Labs Lab 11/01/13 0745 11/02/13 0841  TROPONINI <  0.30 <0.30    Recent Labs Lab 11/02/13 0440  PROBNP 2386.0*     CHEMISTRY  Recent Labs Lab 11/01/13 0745 11/02/13 0440 11/02/13 0841 11/02/13 1700 11/03/13 0402  NA 129* 132*  --  127*  --   K 3.8 3.9  --  3.4*  --   CL 97 97  --  95*  --   CO2 20 22  --  23  --   GLUCOSE 191* 102*  --  116*  --   BUN 15 12  --  10  --   CREATININE 0.86 0.91  --  0.85  --   CALCIUM 8.7 8.3*  --  8.0*  --   MG  --   --  1.8  --  2.0  PHOS  --   --  1.6*  --  2.2*   Estimated Creatinine Clearance: 111.1 ml/min (by C-G formula based on Cr of 0.85).   LIVER  Recent  Labs Lab 11/02/13 0841  AST 31  ALT 39  ALKPHOS 52  BILITOT 1.8*  PROT 6.0  ALBUMIN 2.7*  INR 1.25     INFECTIOUS  Recent Labs Lab 11/01/13 0745 11/02/13 0500 11/02/13 0841 11/03/13 0403 11/03/13 0405  LATICACIDVEN  --   --  1.4  --  1.2  PROCALCITON 0.11 0.30  --  0.40  --      ENDOCRINE CBG (last 3)   Recent Labs  11/02/13 2029 11/02/13 2334 11/03/13 0402  GLUCAP 144* 112* 111*         IMAGING x48h  Ct Angio Chest Pe W/cm &/or Wo Cm  11/02/2013   CLINICAL DATA:  Increasing shortness of breast.  Fever.  EXAM: CT ANGIOGRAPHY CHEST WITH CONTRAST  TECHNIQUE: Multidetector CT imaging of the chest was performed using the standard protocol during bolus administration of intravenous contrast. Multiplanar CT image reconstructions including MIPs were obtained to evaluate the vascular anatomy.  CONTRAST:  OMNIPAQUE IOHEXOL 350 MG/ML SOLN  COMPARISON:  CHEST x-ray 11/02/2013  FINDINGS: There are large central pulmonary emboli with extension into both upper and lower lobes and likely into the right middle lobe as well. The largest clot burden is in the lower lobes bilaterally, right greater than left. Associated airspace disease within the right lower lobe with small right pleural effusion. This could be related to early pulmonary infarct/hemorrhage. There is evidence of right heart strain with elevated RV:LV ratio of 1.8 (normal is less than 0.9). Heart is mildly enlarged. Aorta is normal caliber. No mediastinal, hilar, or axillary adenopathy. Chest wall soft tissues are unremarkable. Imaging into the upper abdomen shows no acute findings.  No acute bony abnormality. Sclerotic focus within a mid thoracic vertebral body likely reflects a bone island.  Review of the MIP images confirms the above findings.  IMPRESSION: Positive for acute PE with CT evidence of right heart strain (RV/LV Ratio = 1.8) consistent with at least submassive (intermediate risk)PE. The presence of  right heart strain has been associated with an increased risk of morbidity and mortality. Consultation with Pulmonary and Critical Care Medicine is recommended.  Airspace disease within the right lower lung could reflect pulmonary infarcts/hemorrhage. Small right pleural effusion.  Critical Value/emergent results were called by telephone at the time of interpretation on 11/02/2013 at 9:35 AM to Dr. Kalman Shan , who verbally acknowledged these results.   Electronically Signed   By: Charlett Nose M.D.   On: 11/02/2013 09:35   Dg Chest [facility] [address]  11/02/2013   CLINICAL DATA:  Followup right-sided pneumonia  EXAM: PORTABLE CHEST - 1 VIEW  COMPARISON:  11/01/2013  FINDINGS: Hazy infiltrate at the right base appear similar in extent. Given history of fever and cough, this is most likely a pneumonia -rather than infarct. There is evidence of a small right pleural effusion. No new opacity. Borderline cardiomegaly, no edema.  IMPRESSION: New small pleural effusion around the persistent right base infiltrate.   Electronically Signed   By: Tiburcio Pea M.D.   On: 11/02/2013 06:12       ASSESSMENT / PLAN:  PULMONARY A:Acute resp failure to RLL infectiious process - aspiration v CAP without specific etiology. Worsening o2 need 12/20. SEver cough  11/03/13 - Not any worse. Not any better overall. Still symptomatic bu maybe cough intenstity and fever peak better  P:   cugh control - tussionex, allb scheduledfentanyl prn Titrate o2; IS IV heparin gtt to continue If worsening hypoexmia or pace of improvement slow, consider cath directed tPA (currently wants to hold off due to newness of program at [facility]) If hypotensive, systemic tpA    CARDIOVASCULAR A: Sinus tach and hypertension P:  Start scheduled norvasc Hydralazine prn Cycle enzymes  RENAL A:  Mild hypophos P:   fluids  GASTROINTESTINAL A:   Severe acid reflyux for days preceding illness. s  11/03/13 - appetite better P:   NPO  except clears and meds and soft solids Protonix IV Pepcid po   HEMATOLOGIC A:  Anemia of critical illness P:  - PRBC for hgb </= 6.9gm%    - exceptions are   -  if ACS susepcted/confirmed then transfuse for hgb </= 8.0gm%,  or    -   active bleeding with hemodynamic instability, then transfuse regardless of hemoglobin value   At at all times try to transfuse 1 unit prbc as possible with exception of active hemorrhage - Heparin SQ bid for dvt proph   INFECTIOUS A:  Doubt RLL CAP   11/03/13 0- Currently on levaquin  P:   Change to po levaquin Consider <= 5 day course of leavqin   ENDOCRINE A:  Metabolic syndrome P:   Phase1 hyperglycemia  NEUROLOGIC A:  INtact   11/03/13 - denies pleurittic pain  P:   fent prn  TODAY'S SUMMARY:    11/03/13 - Rx hypertension with norvasc. He is not any worse but still very symptomatic. Randel Pigg to go home for [employer]. IF pace of improvement slow or more hypoxemic, consider cath directed tPA (IR on board). Currently he is not interested in it due to newness of program.  If hypotensive, do systemic tPA   The patient is critically ill with multiple organ systems failure and requires high complexity decision making for assessment and support, frequent evaluation and titration of therapies, application of advanced monitoring technologies and extensive interpretation of multiple databases.   Critical Care Time devoted to patient care services described in this note is  45  Minutes.  Dr. Kalman Shan, M.D., [facility].C.P Pulmonary and Critical Care Medicine Staff Physician [facility] Pager: [phone], If no answer or between  15:00h - 7:00h: call [phone]  11/03/2013 8:21 AM

## 2013-11-04 ENCOUNTER — Inpatient Hospital Stay (HOSPITAL_COMMUNITY): Payer: 59

## 2013-11-04 ENCOUNTER — Encounter (HOSPITAL_COMMUNITY): Payer: Self-pay | Admitting: Radiology

## 2013-11-04 DIAGNOSIS — I82409 Acute embolism and thrombosis of unspecified deep veins of unspecified lower extremity: Secondary | ICD-10-CM

## 2013-11-04 HISTORY — DX: Acute embolism and thrombosis of unspecified deep veins of unspecified lower extremity: I82.409

## 2013-11-04 LAB — LUPUS ANTICOAGULANT PANEL
DRVVT: 42.6 secs (ref <42.9)
Lupus Anticoagulant: DETECTED — AB
PTT Lupus Anticoagulant: 47.9 secs — ABNORMAL HIGH (ref 28.0–43.0)

## 2013-11-04 LAB — CBC
HCT: 33.4 % — ABNORMAL LOW (ref 39.0–52.0)
Hemoglobin: 12.1 g/dL — ABNORMAL LOW (ref 13.0–17.0)
MCH: 32.6 pg (ref 26.0–34.0)
MCHC: 36.2 g/dL — ABNORMAL HIGH (ref 30.0–36.0)
MCV: 90 fL (ref 78.0–100.0)
RBC: 3.71 MIL/uL — ABNORMAL LOW (ref 4.22–5.81)
RDW: 11.4 % — ABNORMAL LOW (ref 11.5–15.5)

## 2013-11-04 LAB — PROTIME-INR
INR: 1.49 (ref 0.00–1.49)
Prothrombin Time: 17.6 seconds — ABNORMAL HIGH (ref 11.6–15.2)

## 2013-11-04 LAB — BASIC METABOLIC PANEL
BUN: 7 mg/dL (ref 6–23)
Calcium: 8 mg/dL — ABNORMAL LOW (ref 8.4–10.5)
Chloride: 95 mEq/L — ABNORMAL LOW (ref 96–112)
Creatinine, Ser: 0.77 mg/dL (ref 0.50–1.35)
GFR calc Af Amer: >90 mL/min (ref >90)

## 2013-11-04 LAB — CARDIOLIPIN ANTIBODIES, IGG, IGM, IGA
Anticardiolipin IgA: 16 APL U/mL (ref <22)
Anticardiolipin IgM: 20 MPL U/mL — ABNORMAL HIGH (ref <11)

## 2013-11-04 LAB — HEPARIN LEVEL (UNFRACTIONATED)
Heparin Unfractionated: <0.1 IU/mL — ABNORMAL LOW (ref 0.30–0.70)
Heparin Unfractionated: 0.17 IU/mL — ABNORMAL LOW (ref 0.30–0.70)

## 2013-11-04 LAB — PROTEIN S ACTIVITY: Protein S Activity: 75 % (ref 69–129)

## 2013-11-04 LAB — CBC WITH DIFFERENTIAL/PLATELET
Basophils Absolute: 0 10*3/uL (ref 0.0–0.1)
Basophils Relative: 0 % (ref 0–1)
Eosinophils Relative: 1 % (ref 0–5)
HCT: 31.9 % — ABNORMAL LOW (ref 39.0–52.0)
MCH: 32.9 pg (ref 26.0–34.0)
MCHC: 36.1 g/dL — ABNORMAL HIGH (ref 30.0–36.0)
MCV: 91.1 fL (ref 78.0–100.0)
Monocytes Absolute: 1.1 10*3/uL — ABNORMAL HIGH (ref 0.1–1.0)
RDW: 11.4 % — ABNORMAL LOW (ref 11.5–15.5)

## 2013-11-04 LAB — APTT: aPTT: 46 seconds — ABNORMAL HIGH (ref 24–37)

## 2013-11-04 LAB — PROTEIN C, TOTAL: Protein C, Total: 41 % — ABNORMAL LOW (ref 72–160)

## 2013-11-04 LAB — LACTIC ACID, PLASMA: Lactic Acid, Venous: 1.4 mmol/L (ref 0.5–2.2)

## 2013-11-04 LAB — BETA-2-GLYCOPROTEIN I ABS, IGG/M/A
Beta-2 Glyco I IgG: 7 G Units (ref <20)
Beta-2-Glycoprotein I IgM: 45 M Units — ABNORMAL HIGH (ref <20)

## 2013-11-04 LAB — PROTEIN C ACTIVITY: Protein C Activity: 66 % — ABNORMAL LOW (ref 75–133)

## 2013-11-04 MED ORDER — HEPARIN (PORCINE) IN NACL 100-0.45 UNIT/ML-% IJ SOLN
1800.0000 [IU]/h | INTRAMUSCULAR | Status: DC
  Administered 2013-11-04 – 2013-11-05 (×2): 1800 [IU]/h via INTRAVENOUS
  Filled 2013-11-04 (×3): qty 250

## 2013-11-04 MED ORDER — MIDAZOLAM HCL 2 MG/2ML IJ SOLN
INTRAMUSCULAR | Status: AC
  Filled 2013-11-04: qty 4

## 2013-11-04 MED ORDER — IOHEXOL 300 MG/ML  SOLN
25.0000 mL | Freq: Once | INTRAMUSCULAR | Status: AC | PRN
  Administered 2013-11-04: 45 mL via INTRAVENOUS

## 2013-11-04 MED ORDER — HEPARIN (PORCINE) IN NACL 100-0.45 UNIT/ML-% IJ SOLN
1700.0000 [IU]/h | INTRAMUSCULAR | Status: DC
  Administered 2013-11-04: 1700 [IU]/h via INTRAVENOUS
  Filled 2013-11-04 (×2): qty 250

## 2013-11-04 MED ORDER — FENTANYL CITRATE 0.05 MG/ML IJ SOLN
INTRAMUSCULAR | Status: AC
  Filled 2013-11-04: qty 4

## 2013-11-04 MED ORDER — HEPARIN BOLUS VIA INFUSION
2000.0000 [IU] | Freq: Once | INTRAVENOUS | Status: AC
  Administered 2013-11-04: 2000 [IU] via INTRAVENOUS
  Filled 2013-11-04: qty 2000

## 2013-11-04 MED ORDER — HEPARIN (PORCINE) IN NACL 100-0.45 UNIT/ML-% IJ SOLN
1900.0000 [IU]/h | INTRAMUSCULAR | Status: DC
  Administered 2013-11-04: 1900 [IU]/h via INTRAVENOUS
  Filled 2013-11-04: qty 250

## 2013-11-04 MED ORDER — SODIUM CHLORIDE 0.9 % IV SOLN: 250.0000 mL | Freq: Once | INTRAVENOUS | Status: DC

## 2013-11-04 MED ORDER — ALTEPLASE (PULMONARY EMBOLISM) INFUSION
100.0000 mg | Freq: Once | INTRAVENOUS | Status: AC
  Administered 2013-11-04: 100 mg via INTRAVENOUS
  Filled 2013-11-04: qty 100

## 2013-11-04 MED ORDER — POTASSIUM CHLORIDE CRYS ER 20 MEQ PO TBCR
40.0000 meq | EXTENDED_RELEASE_TABLET | ORAL | Status: AC
  Administered 2013-11-04 (×2): 40 meq via ORAL
  Filled 2013-11-04 (×3): qty 2

## 2013-11-04 MED ORDER — FENTANYL CITRATE 0.05 MG/ML IJ SOLN
INTRAMUSCULAR | Status: DC | PRN
  Administered 2013-11-04 (×2): 100 ug via INTRAVENOUS

## 2013-11-04 NOTE — Progress Notes (Addendum)
ANTICOAGULATION CONSULT NOTE - Follow Up Consult  Pharmacy Consult for IV heparin  Indication: pulmonary embolus  Allergies  Allergen Reactions  . Codeine Sulfate     REACTION: nausea    Patient Measurements: Height: 5\' 10"  (177.8 cm) Weight: 211 lb 10.3 oz (96 kg) IBW/kg (Calculated) : 73   Vital Signs: Temp: 100.4 F (38 C) (12/22 2000) Temp src: Axillary (12/22 2000) BP: 145/95 mmHg (12/22 2200) Pulse Rate: 95 (12/22 2200)  Labs:  Recent Labs  11/02/13 0440 11/02/13 0841 11/02/13 1700  11/03/13 0402 11/04/13 0401 11/04/13 1015 11/04/13 1435 11/04/13 2152  HGB 12.8*  --  11.7*  --  11.5* 11.5*  --  12.1*  --   HCT 37.0*  --  32.1*  --  32.8* 31.9*  --  33.4*  --   PLT 182  --  173  --  176 214  --  186  --   APTT  --   --   --   --   --   --  46* 43*  --   LABPROT  --  15.4*  --   --   --   --   --  17.6*  --   INR  --  1.25  --   --   --   --   --  1.49  --   HEPARINUNFRC  --   --  0.23*  < > 0.34 <0.10*  --   --  0.17*  CREATININE 0.91  --  0.85  --   --  0.77  --   --   --   TROPONINI  --  <0.30  --   --   --  <0.30  --   --   --   < > = values in this interval not displayed.  Estimated Creatinine Clearance: 117 ml/min (by C-G formula based on Cr of 0.77).   Medications:  Scheduled:  . sodium chloride  250 mL Intravenous Once  . acetaminophen  650 mg Oral Once  . amLODipine  10 mg Oral Daily  . chlorpheniramine-HYDROcodone  5 mL Oral Q12H  . famotidine  20 mg Oral BID  . levofloxacin  750 mg Oral Daily  . pantoprazole (PROTONIX) IV  40 mg Intravenous Q24H   Infusions:  . sodium chloride 20 mL/hr at 11/04/13 1900  . heparin     PRN: sodium chloride, acetaminophen, albuterol, benzonatate, fentaNYL, fentaNYL, guaiFENesin, hydrALAZINE, HYDROcodone-acetaminophen  Assessment:  57 yo M with 12/20 CT Angio positive for acute PE with right heart strain. Dopplers show LLE positive for DVT.     IV heparin started 12/20.    12/22 IVC filter placed by  IR.    Thrombolysis with alteplase 100 mg x 1 given on 12/22 at 1215  30 minutes after TPA finished infusing, aPTT is 43.    It is appropriate to resume IV heparin with No Bolus at this point as aPTT is < 80.   The goal heparin level is lower (0.3-0.5) for the first 24 hours after infusion, then can increase heparin goal back to normal level 0.3-0.7  Patient with possible developing hematoma at IJ puncture site prior to re-initating IV heparin.  Was evaluated by IR, and discussed with CCM (Dr. Molli Knock)   2245 RN said can't visualize site b/c of dressing, but feels ok. No extended IV interuptions and excessive bleeding noted by RN  HL=0.17 units/ml, slightly below goal.   Goal of Therapy:  Heparin level 0.3-0.5  for the first 24 hours after TPA infusion, then can increase to 0.3-0.7 goal Monitor platelets by anticoagulation protocol: Yes   Plan:  1.) Increase heparin drip to 1800 units/hr 2.) Recheck HL 0500 12/23 3.) Daily Heparin level and CBC   Susanne Greenhouse R PharmD 10:51 PM 11/04/2013   Addendum:  Assessement:  0500 HL= 0.18 units/ml after 1800 units/hr   No bleeding/IV interuptions and dressing site OK per RN  Plan:  Increase heparin drip to 2100 units/hr  Check next HL @ noon  F/U for bleeding  Lorenza Evangelist 11/05/2013 5:29 AM

## 2013-11-04 NOTE — Progress Notes (Signed)
Noted small soft bulging at site of RIJ puncture site; no active bleeding noted; IR called and Caryn Bee in to evaluate. Pharmacy informed and Dr. Molli Knock notified. Heparin can be restarted without a loading dose per Dr. Molli Knock. Pressure dressing applied and ice to site; frequent observation with site checks on-going.Anthonette Legato

## 2013-11-04 NOTE — Progress Notes (Signed)
ANTICOAGULATION CONSULT NOTE - Follow Up Consult  Pharmacy Consult for IV heparin  Indication: pulmonary embolus  Allergies  Allergen Reactions  . Codeine Sulfate     REACTION: nausea    Patient Measurements: Height: 5\' 10"  (177.8 cm) Weight: 211 lb 10.3 oz (96 kg) IBW/kg (Calculated) : 73   Vital Signs: Temp: 98.8 F (37.1 C) (12/22 1200) Temp src: Axillary (12/22 1200) BP: 134/98 mmHg (12/22 1300) Pulse Rate: 100 (12/22 1300)  Labs:  Recent Labs  11/02/13 0440 11/02/13 0841  11/02/13 1700 11/02/13 2330 11/03/13 0402 11/04/13 0401 11/04/13 1015 11/04/13 1435  HGB 12.8*  --   --  11.7*  --  11.5* 11.5*  --  12.1*  HCT 37.0*  --   --  32.1*  --  32.8* 31.9*  --  33.4*  PLT 182  --   --  173  --  176 214  --  186  APTT  --   --   --   --   --   --   --  46* 43*  LABPROT  --  15.4*  --   --   --   --   --   --  17.6*  INR  --  1.25  --   --   --   --   --   --  1.49  HEPARINUNFRC  --   --   < > 0.23* 0.47 0.34 <0.10*  --   --   CREATININE 0.91  --   --  0.85  --   --  0.77  --   --   TROPONINI  --  <0.30  --   --   --   --  <0.30  --   --   < > = values in this interval not displayed.  Estimated Creatinine Clearance: 117 ml/min (by C-G formula based on Cr of 0.77).   Medications:  Scheduled:  . sodium chloride  250 mL Intravenous Once  . acetaminophen  650 mg Oral Once  . amLODipine  10 mg Oral Daily  . chlorpheniramine-HYDROcodone  5 mL Oral Q12H  . famotidine  20 mg Oral BID  . fentaNYL      . levofloxacin  750 mg Oral Daily  . pantoprazole (PROTONIX) IV  40 mg Intravenous Q24H   Infusions:  . sodium chloride 10 mL/hr (11/03/13 1022)   PRN: sodium chloride, acetaminophen, albuterol, benzonatate, fentaNYL, fentaNYL, guaiFENesin, hydrALAZINE, HYDROcodone-acetaminophen  Assessment:  57 yo M with 12/20 CT Angio positive for acute PE with right heart strain. Dopplers show LLE positive for DVT.     IV heparin started 12/20.    12/22 IVC filter placed  by IR.    Thrombolysis with alteplase 100 mg x 1 given on 12/22 at 1215  30 minutes after TPA finished infusing, aPTT is 43.    It is appropriate to resume IV heparin with No Bolus at this point as aPTT is < 80.   The goal heparin level is lower (0.3-0.5) for the first 24 hours after infusion, then can increase heparin goal back to normal level 0.3-0.7  Patient with possible developing hematoma at IJ puncture site prior to re-initating IV heparin.  Was evaluated by IR, and discussed with CCM (Dr. Molli Knock).  Plan is to resume IV heparin now with no bolus per post TPA protocol.  RN to continue to monitor hematoma and s/sx bleeding    Goal of Therapy:  Heparin level 0.3-0.5 for the first  24 hours after TPA infusion, then can increase to 0.3-0.7 goal Monitor platelets by anticoagulation protocol: Yes   Plan:  1.) Resume IV heparin at 1700 units/hr, NO heparin bolus.   2.) Heparin level at 2200 3.) Daily Heparin level and CBC   Joe Molina, Loma Messing PharmD Pager #: [phone] 3:41 PM 11/04/2013

## 2013-11-04 NOTE — Procedures (Signed)
Successful IVC FILTER INSERTION NO COMP STABLE FULL REPORT IN PACS  

## 2013-11-04 NOTE — Consult Note (Signed)
HPI: Joe Molina is an 57 y.o. male with new PE and LLE DVT. Planning to start systemic tPa and IR is requested to place IVC filter first. Chart, PMHx, meds reviewed. Pt drank some clear liquids early this am ~0700.  Past Medical History:  Past Medical History  Diagnosis Date  . Hypertension   . Hyperlipidemia   . GERD (gastroesophageal reflux disease)   . Personal history of colonic adenomas 10/19/2010    Past Surgical History:  Past Surgical History  Procedure Laterality Date  . Cholecystectomy  06//06    Dr. Orson Slick  . Colonscopy  10-19-10    per Dr. Duard Larsen polyps, repeat in 3 years    Family History:  Family History  Problem Relation Age of Onset  . Heart disease      male 1st degree relative at 58 yo    Social History:  reports that he has never smoked. He has never used smokeless tobacco. He reports that he drinks about 3.5 ounces of alcohol per week. He reports that he does not use illicit drugs.  Allergies:  Allergies  Allergen Reactions  . Codeine Sulfate     REACTION: nausea    Medications: Current facility-administered medications:0.9 %  sodium chloride infusion, , Intravenous, Continuous, Kalman Shan, MD, Last Rate: 10 mL/hr at 11/03/13 1022, 10 mL/hr at 11/03/13 1022;  0.9 %  sodium chloride infusion, 250 mL, Intravenous, PRN, Simonne Martinet, NP;  0.9 %  sodium chloride infusion, 250 mL, Intravenous, Once, Alyson Reedy, MD;  acetaminophen (TYLENOL) tablet 650 mg, 650 mg, Oral, Once, Flint Melter, MD acetaminophen (TYLENOL) tablet 650 mg, 650 mg, Oral, Q4H PRN, Kalman Shan, MD, 650 mg at 11/03/13 1222;  albuterol (PROVENTIL) (5 MG/ML) 0.5% nebulizer solution 2.5 mg, 2.5 mg, Nebulization, Q3H PRN, Simonne Martinet, NP;  alteplase (ACTIVASE) 1 mg/mL infusion 100 mg, 100 mg, Intravenous, Once, Alyson Reedy, MD;  amLODipine (NORVASC) tablet 10 mg, 10 mg, Oral, Daily, Kalman Shan, MD, 10 mg at 11/04/13 1032 benzonatate (TESSALON)  capsule 200 mg, 200 mg, Oral, TID PRN, Lupita Leash, MD, 200 mg at 11/03/13 1754;  chlorpheniramine-HYDROcodone (TUSSIONEX) 10-8 MG/5ML suspension 5 mL, 5 mL, Oral, Q12H, Kalman Shan, MD, 5 mL at 11/04/13 1032;  famotidine (PEPCID) tablet 20 mg, 20 mg, Oral, BID, Kalman Shan, MD, 20 mg at 11/04/13 1032;  fentaNYL (SUBLIMAZE) 0.05 MG/ML injection, , , ,  fentaNYL (SUBLIMAZE) injection 25-100 mcg, 25-100 mcg, Intravenous, Q2H PRN, Kalman Shan, MD, 50 mcg at 11/03/13 2150;  guaiFENesin tablet 200 mg, 200 mg, Oral, Q4H PRN, Lupita Leash, MD, 200 mg at 11/01/13 1308;  hydrALAZINE (APRESOLINE) injection 10-40 mg, 10-40 mg, Intravenous, Q4H PRN, Kalman Shan, MD, 10 mg at 11/03/13 1731 HYDROcodone-acetaminophen (NORCO/VICODIN) 5-325 MG per tablet 2 tablet, 2 tablet, Oral, Q4H PRN, Simonne Martinet, NP, 2 tablet at 11/02/13 1802;  levofloxacin (LEVAQUIN) tablet 750 mg, 750 mg, Oral, Daily, Kalman Shan, MD, 750 mg at 11/04/13 1032;  midazolam (VERSED) 2 MG/2ML injection, , , , ;  pantoprazole (PROTONIX) injection 40 mg, 40 mg, Intravenous, Q24H, Simonne Martinet, NP, 40 mg at 11/04/13 1032  Please HPI for pertinent positives, otherwise complete 10 system ROS negative.  Physical Exam: BP 150/93  Pulse 88  Temp(Src) 98.8 F (37.1 C) (Axillary)  Resp 35  Ht 5\' 10"  (1.778 m)  Wt 211 lb 10.3 oz (96 kg)  BMI 30.37 kg/m2  SpO2 97% Body mass index is 30.37 kg/(m^2).   General  Appearance:  Alert, cooperative, no distress, appears stated age  Head:  Normocephalic, without obvious abnormality, atraumatic  ENT: Unremarkable  Neck: Supple, symmetrical, trachea midline  Lungs:   Breathing relaxed. Slightly diminished right BS  Chest Wall:  No tenderness or deformity  Heart:  Regular rate and rhythm, S1, S2 normal, no murmur, rub or gallop.  Abdomen:   Soft, non-tender, non distended.  Extremities: Extremities normal, atraumatic. Trace to 1+ edema LLE  Pulses: 2+ and symmetric   Neurologic: Normal affect, no gross deficits.   Results for orders placed during the hospital encounter of 11/01/13 (from the past 48 hour(s))  ANTITHROMBIN III     Status: Abnormal   Collection Time    11/02/13 10:40 AM      Result Value Range   AntiThromb III Func 72 (*) 75 - 120 %   Comment: Performed at [facility]  HOMOCYSTEINE     Status: None   Collection Time    11/02/13 10:40 AM      Result Value Range   Homocysteine 4.8  4.0 - 15.4 umol/L   Comment: Performed at [facility]  GLUCOSE, CAPILLARY     Status: Abnormal   Collection Time    11/02/13 12:22 PM      Result Value Range   Glucose-Capillary 118 (*) 70 - 99 mg/dL   Comment 1 Documented in Chart     Comment 2 Notify RN    GLUCOSE, CAPILLARY     Status: Abnormal   Collection Time    11/02/13  4:14 PM      Result Value Range   Glucose-Capillary 121 (*) 70 - 99 mg/dL   Comment 1 Documented in Chart     Comment 2 Notify RN    BASIC METABOLIC PANEL     Status: Abnormal   Collection Time    11/02/13  5:00 PM      Result Value Range   Sodium 127 (*) 135 - 145 mEq/L   Potassium 3.4 (*) 3.5 - 5.1 mEq/L   Chloride 95 (*) 96 - 112 mEq/L   CO2 23  19 - 32 mEq/L   Glucose, Bld 116 (*) 70 - 99 mg/dL   BUN 10  6 - 23 mg/dL   Creatinine, Ser 1.61  0.50 - 1.35 mg/dL   Calcium 8.0 (*) 8.4 - 10.5 mg/dL   GFR calc non Af Amer >90  >90 mL/min   GFR calc Af Amer >90  >90 mL/min   Comment: (NOTE)     The eGFR has been calculated using the CKD EPI equation.     This calculation has not been validated in all clinical situations.     eGFR's persistently <90 mL/min signify possible Chronic Kidney     Disease.  CBC     Status: Abnormal   Collection Time    11/02/13  5:00 PM      Result Value Range   WBC 13.0 (*) 4.0 - 10.5 K/uL   RBC 3.51 (*) 4.22 - 5.81 MIL/uL   Hemoglobin 11.7 (*) 13.0 - 17.0 g/dL   HCT 09.6 (*) 04.5 - 40.9 %   MCV 91.5  78.0 - 100.0 fL   MCH 33.3  26.0 - 34.0 pg   MCHC 36.4 (*) 30.0 -  36.0 g/dL   RDW 81.1 (*) 91.4 - 78.2 %   Platelets 173  150 - 400 K/uL  HEPARIN LEVEL (UNFRACTIONATED)     Status: Abnormal   Collection Time  11/02/13  5:00 PM      Result Value Range   Heparin Unfractionated 0.23 (*) 0.30 - 0.70 IU/mL   Comment:            IF HEPARIN RESULTS ARE BELOW     EXPECTED VALUES, AND PATIENT     DOSAGE HAS BEEN CONFIRMED,     SUGGEST FOLLOW UP TESTING     OF ANTITHROMBIN III LEVELS.  GLUCOSE, CAPILLARY     Status: Abnormal   Collection Time    11/02/13  8:29 PM      Result Value Range   Glucose-Capillary 144 (*) 70 - 99 mg/dL  HEPARIN LEVEL (UNFRACTIONATED)     Status: None   Collection Time    11/02/13 11:30 PM      Result Value Range   Heparin Unfractionated 0.47  0.30 - 0.70 IU/mL   Comment:            IF HEPARIN RESULTS ARE BELOW     EXPECTED VALUES, AND PATIENT     DOSAGE HAS BEEN CONFIRMED,     SUGGEST FOLLOW UP TESTING     OF ANTITHROMBIN III LEVELS.  GLUCOSE, CAPILLARY     Status: Abnormal   Collection Time    11/02/13 11:34 PM      Result Value Range   Glucose-Capillary 112 (*) 70 - 99 mg/dL  MAGNESIUM     Status: None   Collection Time    11/03/13  4:02 AM      Result Value Range   Magnesium 2.0  1.5 - 2.5 mg/dL  PHOSPHORUS     Status: Abnormal   Collection Time    11/03/13  4:02 AM      Result Value Range   Phosphorus 2.2 (*) 2.3 - 4.6 mg/dL  CBC WITH DIFFERENTIAL     Status: Abnormal   Collection Time    11/03/13  4:02 AM      Result Value Range   WBC 12.2 (*) 4.0 - 10.5 K/uL   RBC 3.57 (*) 4.22 - 5.81 MIL/uL   Hemoglobin 11.5 (*) 13.0 - 17.0 g/dL   HCT 16.1 (*) 09.6 - 04.5 %   MCV 91.9  78.0 - 100.0 fL   MCH 32.2  26.0 - 34.0 pg   MCHC 35.1  30.0 - 36.0 g/dL   RDW 40.9  81.1 - 91.4 %   Platelets 176  150 - 400 K/uL   Neutrophils Relative % 79 (*) 43 - 77 %   Neutro Abs 9.6 (*) 1.7 - 7.7 K/uL   Lymphocytes Relative 9 (*) 12 - 46 %   Lymphs Abs 1.1  0.7 - 4.0 K/uL   Monocytes Relative 11  3 - 12 %   Monocytes  Absolute 1.4 (*) 0.1 - 1.0 K/uL   Eosinophils Relative 1  0 - 5 %   Eosinophils Absolute 0.1  0.0 - 0.7 K/uL   Basophils Relative 0  0 - 1 %   Basophils Absolute 0.0  0.0 - 0.1 K/uL  HEPARIN LEVEL (UNFRACTIONATED)     Status: None   Collection Time    11/03/13  4:02 AM      Result Value Range   Heparin Unfractionated 0.34  0.30 - 0.70 IU/mL   Comment:            IF HEPARIN RESULTS ARE BELOW     EXPECTED VALUES, AND PATIENT     DOSAGE HAS BEEN CONFIRMED,     SUGGEST FOLLOW UP  TESTING     OF ANTITHROMBIN III LEVELS.  GLUCOSE, CAPILLARY     Status: Abnormal   Collection Time    11/03/13  4:02 AM      Result Value Range   Glucose-Capillary 111 (*) 70 - 99 mg/dL  PROCALCITONIN     Status: None   Collection Time    11/03/13  4:03 AM      Result Value Range   Procalcitonin 0.40     Comment:            Interpretation:     PCT (Procalcitonin) <= 0.5 ng/mL:     Systemic infection (sepsis) is not likely.     Local bacterial infection is possible.     (NOTE)             ICU PCT Algorithm               Non ICU PCT Algorithm        ----------------------------     ------------------------------             PCT < 0.25 ng/mL                 PCT < 0.1 ng/mL         Stopping of antibiotics            Stopping of antibiotics           strongly encouraged.               strongly encouraged.        ----------------------------     ------------------------------           PCT level decrease by               PCT < 0.25 ng/mL           >= 80% from peak PCT           OR PCT 0.25 - 0.5 ng/mL          Stopping of antibiotics                                                 encouraged.         Stopping of antibiotics               encouraged.        ----------------------------     ------------------------------           PCT level decrease by              PCT >= 0.25 ng/mL           < 80% from peak PCT            AND PCT >= 0.5 ng/mL            Continuing antibiotics                                                   encouraged.           Continuing antibiotics                encouraged.        ----------------------------     ------------------------------  PCT level increase compared          PCT > 0.5 ng/mL             with peak PCT AND              PCT >= 0.5 ng/mL             Escalation of antibiotics                                              strongly encouraged.          Escalation of antibiotics            strongly encouraged.  LACTIC ACID, PLASMA     Status: None   Collection Time    11/03/13  4:05 AM      Result Value Range   Lactic Acid, Venous 1.2  0.5 - 2.2 mmol/L  GLUCOSE, CAPILLARY     Status: Abnormal   Collection Time    11/03/13  7:59 AM      Result Value Range   Glucose-Capillary 117 (*) 70 - 99 mg/dL  MAGNESIUM     Status: None   Collection Time    11/04/13  4:01 AM      Result Value Range   Magnesium 2.1  1.5 - 2.5 mg/dL  PHOSPHORUS     Status: None   Collection Time    11/04/13  4:01 AM      Result Value Range   Phosphorus 2.5  2.3 - 4.6 mg/dL  CBC WITH DIFFERENTIAL     Status: Abnormal   Collection Time    11/04/13  4:01 AM      Result Value Range   WBC 9.6  4.0 - 10.5 K/uL   RBC 3.50 (*) 4.22 - 5.81 MIL/uL   Hemoglobin 11.5 (*) 13.0 - 17.0 g/dL   HCT 16.1 (*) 09.6 - 04.5 %   MCV 91.1  78.0 - 100.0 fL   MCH 32.9  26.0 - 34.0 pg   MCHC 36.1 (*) 30.0 - 36.0 g/dL   RDW 40.9 (*) 81.1 - 91.4 %   Platelets 214  150 - 400 K/uL   Neutrophils Relative % 78 (*) 43 - 77 %   Neutro Abs 7.5  1.7 - 7.7 K/uL   Lymphocytes Relative 9 (*) 12 - 46 %   Lymphs Abs 0.9  0.7 - 4.0 K/uL   Monocytes Relative 11  3 - 12 %   Monocytes Absolute 1.1 (*) 0.1 - 1.0 K/uL   Eosinophils Relative 1  0 - 5 %   Eosinophils Absolute 0.1  0.0 - 0.7 K/uL   Basophils Relative 0  0 - 1 %   Basophils Absolute 0.0  0.0 - 0.1 K/uL  HEPARIN LEVEL (UNFRACTIONATED)     Status: Abnormal   Collection Time    11/04/13  4:01 AM      Result Value Range   Heparin  Unfractionated <0.10 (*) 0.30 - 0.70 IU/mL   Comment:            IF HEPARIN RESULTS ARE BELOW     EXPECTED VALUES, AND PATIENT     DOSAGE HAS BEEN CONFIRMED,     SUGGEST FOLLOW UP TESTING     OF ANTITHROMBIN III LEVELS.     REPEATED TO VERIFY  BASIC METABOLIC PANEL  Status: Abnormal   Collection Time    11/04/13  4:01 AM      Result Value Range   Sodium 130 (*) 135 - 145 mEq/L   Potassium 2.9 (*) 3.5 - 5.1 mEq/L   Chloride 95 (*) 96 - 112 mEq/L   CO2 23  19 - 32 mEq/L   Glucose, Bld 129 (*) 70 - 99 mg/dL   BUN 7  6 - 23 mg/dL   Creatinine, Ser 1.61  0.50 - 1.35 mg/dL   Calcium 8.0 (*) 8.4 - 10.5 mg/dL   GFR calc non Af Amer >90  >90 mL/min   GFR calc Af Amer >90  >90 mL/min   Comment: (NOTE)     The eGFR has been calculated using the CKD EPI equation.     This calculation has not been validated in all clinical situations.     eGFR's persistently <90 mL/min signify possible Chronic Kidney     Disease.  LACTIC ACID, PLASMA     Status: None   Collection Time    11/04/13  4:01 AM      Result Value Range   Lactic Acid, Venous 1.4  0.5 - 2.2 mmol/L  TROPONIN I     Status: None   Collection Time    11/04/13  4:01 AM      Result Value Range   Troponin I <0.30  <0.30 ng/mL   Comment:            Due to the release kinetics of cTnI,     a negative result within the first hours     of the onset of symptoms does not rule out     myocardial infarction with certainty.     If myocardial infarction is still suspected,     repeat the test at appropriate intervals.   No results found.  Assessment/Plan LLE DVT/PE For IVC filter. Explained procedure, risks, complications, use of sedation. Explained possibility of retrieval. Labs reviewed. Heparin has been stopped. Consent signed in chart  Brayton El PA-C 11/04/2013, 10:38 AM

## 2013-11-04 NOTE — Progress Notes (Signed)
Name: Joe Molina MRN: [id number] DOB: 02-Aug-1956   PCP Nelwyn Salisbury, MD   ADMISSION DATE:  11/01/2013  REFERRING MD :  Effie Shy  PRIMARY SERVICE:  Pulmonary   CHIEF COMPLAINT:  Cough/fever/dyspnea   BRIEF PATIENT DESCRIPTION:   57 year old male seen by PCP on 12/15  After sudden onset fever, chills, exertional dyspnea and severe but essentially non productive cough w/ associated pleuritic type CP of the right chest first noted on 12/13 when he got up to walk the dog. He reported no obvious sick exposures. Did have his influenza vaccine this year. He did have a severe reflux episode on 12/11, which awoke him from sleep with acid taste in mouth and burning in chest. At his PCPs he was started on azithro for what was felt to be bronchitis vs possible viral process/flu. Since 15th had progressive dyspnea, right sided pleuritic CP, and some streaky hemoptysis on 12/19. Presented to ER 12/19. Had RLL infiltrate, fever, leukocytosis. Admitted to PCCM service for CAP.   SIGNIFICANT EVENTS / STUDIES:  12/15: seen by PCP placed on ZPAK after 2 days of sudden onset, fever, cough and dyspnea.  12/19: Admitted to SDU  12/20 - worse to 4L 02. Submasive PE with RV strain (normal trop and lactate but high bnp), hypertensive,  Large LLE DVT +. Rx IV heparin. IR consult - held off on cathether directed lysis.l Gives hx of  Several long car trip Fall 2014 including to [place], [place] thanksigiving 2014 followed by calf pain 12/22 Extensive discussion with patient wife, reviewed films with them.  After extensive discussion with them and conferring with other PCCM MD's decision was made to place IVC filter and proceed with systemic tPA rather than catheter directed.  Patient continues to refuse catheter directed tPA.  LINES / TUBES: PIV  CULTURES: Sputum 12/19>> neg U strep 12/19>>>neg U legionella 12/19>>> Influenza panel 12/19>>>neg Viral panel 12/19>>> PENDING MRSA PCR 12/19/1 4 -  NEG  ANTIBIOTICS: Levaquin 12/19>>> Rocephin 12/19>>>12/20  SUBJECTIVE/OVERNIGHT/INTERVAL HX  11/03/13 - very dyspneic with any activity. Overall feels stable and maybe cough better but feels has not turned corner. Pink tinged sputum + . Cough spells + but improved. Appetite good. Says mental fog better. STill on 4L [place]. Fever still + 101F but not feeling it 12/22 remains with tachypnea (RR is mid 30's) and hypoxic even with supplemental O2.  Able to speak today however.  VITAL SIGNS: Temp:  [98 F (36.7 C)-102.5 F (39.2 C)] 98.8 F (37.1 C) (12/22 0800) Resp:  [21-38] 35 (12/22 0900) BP: (135-186)/(69-133) 150/93 mmHg (12/22 0900) SpO2:  [91 %-99 %] 97 % (12/22 0900) Weight:  [96 kg (211 lb 10.3 oz)] 96 kg (211 lb 10.3 oz) (12/22 0400) 2 liters   PHYSICAL EXAMINATION: General:  Acutely ill appearing white male, coughing  Less. Color looks better. Says good appetite Neuro:  A&O, non-focal  . Moves all 4s. CAM-ICU neg for delirium HEENT:  ATNC, MMM, face flushed  Cardiovascular: rrr Lungs:  Dyspneic talking on 4LNC and brings pulse down to 88%.  Right basilar rales posteriorly . RR 30s atleast. Not paradoxicaly.  Sentences halted by dyspnea Abdomen:  Soft, non-tender, + bowel sounds  Musculoskeletal:  Intact  Skin:  Trace LE edema   PULMONARY  Recent Labs Lab 11/01/13 0800  PHART 7.532*  PCO2ART 25.6*  PO2ART 68.1*  HCO3 21.4  TCO2 18.6  O2SAT 95.0   CBC  Recent Labs Lab 11/02/13 1700 11/03/13 0402 11/04/13 0401  HGB  11.7* 11.5* 11.5*  HCT 32.1* 32.8* 31.9*  WBC 13.0* 12.2* 9.6  PLT 173 176 214   COAGULATION  Recent Labs Lab 11/02/13 0841  INR 1.25   CARDIAC   Recent Labs Lab 11/01/13 0745 11/02/13 0841 11/04/13 0401  TROPONINI <0.30 <0.30 <0.30   Recent Labs Lab 11/02/13 0440  PROBNP 2386.0*   CHEMISTRY  Recent Labs Lab 11/01/13 0745 11/02/13 0440 11/02/13 0841 11/02/13 1700 11/03/13 0402 11/04/13 0401  NA 129* 132*  --  127*  --   130*  K 3.8 3.9  --  3.4*  --  2.9*  CL 97 97  --  95*  --  95*  CO2 20 22  --  23  --  23  GLUCOSE 191* 102*  --  116*  --  129*  BUN 15 12  --  10  --  7  CREATININE 0.86 0.91  --  0.85  --  0.77  CALCIUM 8.7 8.3*  --  8.0*  --  8.0*  MG  --   --  1.8  --  2.0 2.1  PHOS  --   --  1.6*  --  2.2* 2.5   Estimated Creatinine Clearance: 117 ml/min (by C-G formula based on Cr of 0.77).  LIVER  Recent Labs Lab 11/02/13 0841  AST 31  ALT 39  ALKPHOS 52  BILITOT 1.8*  PROT 6.0  ALBUMIN 2.7*  INR 1.25   INFECTIOUS  Recent Labs Lab 11/01/13 0745 11/02/13 0500 11/02/13 0841 11/03/13 0403 11/03/13 0405 11/04/13 0401  LATICACIDVEN  --   --  1.4  --  1.2 1.4  PROCALCITON 0.11 0.30  --  0.40  --   --    ENDOCRINE CBG (last 3)   Recent Labs  11/02/13 2334 11/03/13 0402 11/03/13 0759  GLUCAP 112* 111* 117*   IMAGING x48h  No results found.  ASSESSMENT / PLAN:  PULMONARY A:Acute resp failure to RLL infectiious process - aspiration v CAP without specific etiology. Worsening o2 need 12/20. SEver cough 12/22 see discussion above. P:   - Titrate o2 for sats. - IS. - Hold IV heparin for tPA after filter placement today.  CARDIOVASCULAR A: Sinus tach and hypertension P:  - Continue scheduled norvasc. - Hydralazine prn.  RENAL A: Electrolytes concerns. P:   - IVF as ordered. - Replace electrolytes as needed.  GASTROINTESTINAL A:   Severe acid reflyux for days preceding illness. s  11/03/13 - appetite better P:   - After tPA will consider diet. - Protonix IV. - Pepcid PO.  HEMATOLOGIC A:  Anemia of critical illness P:  - PRBC for hgb </= 6.9gm%    - exceptions are   -  if ACS susepcted/confirmed then transfuse for hgb </= 8.0gm%,  or    -   active bleeding with hemodynamic instability, then transfuse regardless of hemoglobin value   At at all times try to transfuse 1 unit prbc as possible with exception of active hemorrhage - Heparin SQ bid for dvt  proph  INFECTIOUS A:  Doubt RLL CAP 11/03/13 0- Currently on levaquin  P:   - Change to po levaquin once taking PO and finish an 8 day course.  ENDOCRINE A:  Metabolic syndrome P:   - Phase1 hyperglycemia.  NEUROLOGIC A:  Intact.  11/03/13 - denies pleurittic pain  P:   - PRN fentanyl for pain.  TODAY'S SUMMARY:  Spoke with patient and wife extensive, also conferred with multiple PCCM-MDs, the difficult  part here is the age of the clot.  No doubt that tPA will help this patient long term given age and activity level.  He is no longer improving (seems more stagnant).  Catheter directed tPA will only address the PE but will not address extensive DVT.  Patient was given the option of systemic tPA or catheter directed.  After discussion of risks and benefits of both decision was made to proceed with placement of filter given the DVT with an unstable clot then proceed with systemic tPA after that.  The patient is critically ill with multiple organ systems failure and requires high complexity decision making for assessment and support, frequent evaluation and titration of therapies, application of advanced monitoring technologies and extensive interpretation of multiple databases.   Critical Care Time devoted to patient care services described in this note is 120 Minutes.  Alyson Reedy, M.D. [facility] Pulmonary/Critical Care Medicine. Pager: [phone]. After hours pager: [phone].

## 2013-11-04 NOTE — Progress Notes (Signed)
ANTICOAGULATION CONSULT NOTE - Follow Up  Pharmacy Consult for Heparin Indication: pulmonary embolus  Allergies  Allergen Reactions  . Codeine Sulfate     REACTION: nausea    Patient Measurements: Height: 5\' 10"  (177.8 cm) Weight: 211 lb 10.3 oz (96 kg) IBW/kg (Calculated) : 73 Heparin Dosing Weight: 92 kg  Vital Signs: Temp: 98.5 F (36.9 C) (12/22 0400) Temp src: Oral (12/22 0400) BP: 146/90 mmHg (12/22 0600)  Labs:  Recent Labs  11/01/13 0745 11/02/13 0440 11/02/13 0841  11/02/13 1700 11/02/13 2330 11/03/13 0402 11/04/13 0401  HGB 13.2 12.8*  --   --  11.7*  --  11.5* 11.5*  HCT 36.6* 37.0*  --   --  32.1*  --  32.8* 31.9*  PLT 173 182  --   --  173  --  176 214  LABPROT  --   --  15.4*  --   --   --   --   --   INR  --   --  1.25  --   --   --   --   --   HEPARINUNFRC  --   --   --   < > 0.23* 0.47 0.34 <0.10*  CREATININE 0.86 0.91  --   --  0.85  --   --  0.77  TROPONINI <0.30  --  <0.30  --   --   --   --  <0.30  < > = values in this interval not displayed.  Estimated Creatinine Clearance: 117 ml/min (by C-G formula based on Cr of 0.77).   Assessment: 16 yoM presents with worsening cough/fever/dyspnea following treatment with azithromycin outpatient.  Admitted 12/19 for treatment of pneumonia.  Respiratory symptoms worsened.  12/20 CT Angio positive for acute PE with right heart strain.  Dopplers show LLE positive for DVT.  Began IV heparin 12/20.     Heparin level undetectable this morning, had been therapeutic with infusion rate at 1700 units/hr. No issues with IV site or heparin bag per RN.  Pink-tinged sputum reported by RN but states this is not new  H/H low but stable, Platelets improved to wnl  Renal function stable - SCr 0.85, CrCl>100 ml/min  Goal of Therapy:  Heparin level 0.3-0.7 units/ml Monitor platelets by anticoagulation protocol: Yes   Plan:   Give small heparin bolus of 2000 units IV x 1  Increase heparin infusion to 1900  units/hr  Recheck heparin level in 6hrs   Loralee Pacas, PharmD, BCPS Pager: [phone] 11/04/2013,7:06 AM

## 2013-11-04 NOTE — Progress Notes (Signed)
CARE MANAGEMENT NOTE 11/04/2013  Patient:  Joe Molina, Joe Molina   Account Number:  [id number]  Date Initiated:  11/01/2013  Documentation initiated by:  Jacia Sickman  Subjective/Objective Assessment:   failed outpt treatment with pcp for congestion and temp. Increased dyspnea and temp poss pna     Action/Plan:   home when stable   Anticipated DC Date:  11/07/2013   Anticipated DC Plan:  HOME/SELF CARE  In-house referral  NA      DC Planning Services  NA      PAC Choice  NA   Choice offered to / List presented to:  NA   DME arranged  NA      DME agency  NA     HH arranged  NA      HH agency  NA   Status of service:  In process, will continue to follow Medicare Important Message given?  NA - LOS <3 / Initial given by admissions (If response is "NO", the following Medicare IM given date fields will be blank) Date Medicare IM given:   Date Additional Medicare IM given:    Discharge Disposition:    Per UR Regulation:  Reviewed for med. necessity/level of care/duration of stay  If discussed at Long Length of Stay Meetings, dates discussed:    Comments:  [id number]/Selwyn Reason Stark Jock, BSN, [place] [phone] Chart Reviewed for discharge and hospital needs. ivc filter placed on  [id number] due to finding of a large P.Emboli. Discharge needs at time of review:  None present will follow for needs. Review of patient progress due on [id number].   [id number] Earlene Plater RN, BSN, [place] [phone] Chart Reviewed for discharge and hospital needs. Discharge needs at time of review:  None present will follow for needs. Review of patient progress due on [id number].

## 2013-11-04 NOTE — Progress Notes (Signed)
Called to come evaluate possible developing hematoma at (R)IJ  puncture site. TPA infusion is completed and heparin has not yet restarted. RN noticed new soft swelling at site. Pt denies c/o pain  RT IJ puncture site bandaid dry. Soft focal swelling, no ecchymosis, no tenderness. Pressure dressing applied and cold compress. Will continue to monitor closely. Would not alter plans to resume heparin.  Brayton El PA-C Interventional Radiology 11/04/2013 3:30 PM

## 2013-11-04 NOTE — Significant Event (Signed)
Lab Results  Component Value Date   CREATININE 0.77 11/04/2013   BUN 7 11/04/2013   NA 130* 11/04/2013   K 2.9* 11/04/2013   CL 95* 11/04/2013   CO2 23 11/04/2013   Will give po KCL.  Coralyn Helling, MD 11/04/2013, 6:25 AM

## 2013-11-05 LAB — CBC WITH DIFFERENTIAL/PLATELET
Basophils Absolute: 0 10*3/uL (ref 0.0–0.1)
Basophils Relative: 0 % (ref 0–1)
Eosinophils Absolute: 0.3 10*3/uL (ref 0.0–0.7)
HCT: 31.7 % — ABNORMAL LOW (ref 39.0–52.0)
Hemoglobin: 11.3 g/dL — ABNORMAL LOW (ref 13.0–17.0)
Lymphocytes Relative: 12 % (ref 12–46)
Lymphs Abs: 1.1 10*3/uL (ref 0.7–4.0)
MCH: 32.1 pg (ref 26.0–34.0)
MCHC: 35.6 g/dL (ref 30.0–36.0)
Neutro Abs: 6.5 10*3/uL (ref 1.7–7.7)
Neutrophils Relative %: 72 % (ref 43–77)
Platelets: 209 10*3/uL (ref 150–400)
WBC: 9 10*3/uL (ref 4.0–10.5)

## 2013-11-05 LAB — BASIC METABOLIC PANEL
BUN: 11 mg/dL (ref 6–23)
CO2: 22 mEq/L (ref 19–32)
Calcium: 8.1 mg/dL — ABNORMAL LOW (ref 8.4–10.5)
Chloride: 98 mEq/L (ref 96–112)
GFR calc Af Amer: >90 mL/min (ref >90)
GFR calc non Af Amer: >90 mL/min (ref >90)
Glucose, Bld: 106 mg/dL — ABNORMAL HIGH (ref 70–99)
Potassium: 3.4 mEq/L — ABNORMAL LOW (ref 3.5–5.1)
Sodium: 131 mEq/L — ABNORMAL LOW (ref 135–145)

## 2013-11-05 LAB — PHOSPHORUS: Phosphorus: 2.5 mg/dL (ref 2.3–4.6)

## 2013-11-05 LAB — MAGNESIUM: Magnesium: 2.1 mg/dL (ref 1.5–2.5)

## 2013-11-05 MED ORDER — METOPROLOL TARTRATE 12.5 MG HALF TABLET
12.5000 mg | ORAL_TABLET | Freq: Two times a day (BID) | ORAL | Status: DC
  Administered 2013-11-05 – 2013-11-06 (×3): 12.5 mg via ORAL
  Filled 2013-11-05 (×4): qty 1

## 2013-11-05 MED ORDER — POTASSIUM CHLORIDE CRYS ER 20 MEQ PO TBCR
20.0000 meq | EXTENDED_RELEASE_TABLET | ORAL | Status: DC
  Administered 2013-11-05: 20 meq via ORAL
  Filled 2013-11-05: qty 1

## 2013-11-05 MED ORDER — RIVAROXABAN 15 MG PO TABS
15.0000 mg | ORAL_TABLET | Freq: Two times a day (BID) | ORAL | Status: AC
  Administered 2013-11-05 (×2): 15 mg via ORAL
  Filled 2013-11-05 (×2): qty 1

## 2013-11-05 MED ORDER — POTASSIUM CHLORIDE CRYS ER 20 MEQ PO TBCR
40.0000 meq | EXTENDED_RELEASE_TABLET | Freq: Three times a day (TID) | ORAL | Status: AC
  Administered 2013-11-05 (×2): 40 meq via ORAL
  Filled 2013-11-05 (×2): qty 2

## 2013-11-05 MED ORDER — HEPARIN (PORCINE) IN NACL 100-0.45 UNIT/ML-% IJ SOLN
2100.0000 [IU]/h | INTRAMUSCULAR | Status: AC
  Administered 2013-11-05: 2100 [IU]/h via INTRAVENOUS
  Filled 2013-11-05: qty 250

## 2013-11-05 MED ORDER — RIVAROXABAN 15 MG PO TABS
15.0000 mg | ORAL_TABLET | Freq: Two times a day (BID) | ORAL | Status: DC
  Administered 2013-11-06: 15 mg via ORAL
  Filled 2013-11-05 (×5): qty 1

## 2013-11-05 NOTE — Progress Notes (Signed)
ANTICOAGULATION CONSULT NOTE - Follow Up Consult  Pharmacy Consult to transition IV heparin to Xarelto Indication: Submassive PE, LLE DVT  Allergies  Allergen Reactions  . Codeine Sulfate     REACTION: nausea    Patient Measurements: Height: 5\' 10"  (177.8 cm) Weight: 206 lb 9.1 oz (93.7 kg) IBW/kg (Calculated) : 73   Vital Signs: Temp: 98.8 F (37.1 C) (12/23 0800) Temp src: Oral (12/23 0800) BP: 153/90 mmHg (12/23 0600) Pulse Rate: 95 (12/23 0600)  Labs:  Recent Labs  11/02/13 1700  11/04/13 0401 11/04/13 1015 11/04/13 1435 11/04/13 2152 11/05/13 0350  HGB 11.7*  < > 11.5*  --  12.1*  --  11.3*  HCT 32.1*  < > 31.9*  --  33.4*  --  31.7*  PLT 173  < > 214  --  186  --  209  APTT  --   --   --  46* 43*  --   --   LABPROT  --   --   --   --  17.6*  --   --   INR  --   --   --   --  1.49  --   --   HEPARINUNFRC 0.23*  < > <0.10*  --   --  0.17* 0.18*  CREATININE 0.85  --  0.77  --   --   --  0.76  TROPONINI  --   --  <0.30  --   --   --   --   < > = values in this interval not displayed.  Estimated Creatinine Clearance: 115.7 ml/min (by C-G formula based on Cr of 0.76).   Medications:  Scheduled:  . amLODipine  10 mg Oral Daily  . chlorpheniramine-HYDROcodone  5 mL Oral Q12H  . levofloxacin  750 mg Oral Daily  . metoprolol tartrate  12.5 mg Oral BID  . potassium chloride  40 mEq Oral TID  . rivaroxaban  15 mg Oral BID  . [START ON 11/06/2013] Rivaroxaban  15 mg Oral BID WC   Infusions:  . sodium chloride 20 mL/hr at 11/05/13 0355  . heparin 2,100 Units/hr (11/05/13 0546)   PRN: sodium chloride, acetaminophen, albuterol, fentaNYL, fentaNYL, guaiFENesin, hydrALAZINE  Assessment:  57 yo M with 12/20 CT Angio positive for acute PE with right heart strain. Dopplers show LLE positive for DVT.     IV heparin started 12/20.    12/22 IVC filter placed by IR.    Thrombolysis with alteplase 100 mg x 1 given on 12/22 at 1215, heparin resumed post  procedure  H/H improved, Pltc slightly decreased, no overt bleeding reported  Transitioning to Xarelto today - begin 24hr after TPA given.   Goal of Therapy:  Therapeutic anticoagulation  Monitor platelets by anticoagulation protocol: Yes   Plan:   Begin Xarelto 15mg  BID today at 12 noon, will get 2 doses today  After 3 weeks total, decrease to 20mg  once daily  Educated patient and wife regarding therapy, including indication, dosage, adverse effects, monitoring and drug interactions (cautioned about overuse of ibuprofen and fish oil).  Provided coupon for first 3 weeks of Xarelto thearpy  Loralee Pacas, PharmD, BCPS Pager: [phone] 11/05/2013 10:32 AM

## 2013-11-05 NOTE — Progress Notes (Signed)
[facility] ADULT ICU REPLACEMENT PROTOCOL FOR AM LAB REPLACEMENT ONLY  The patient does apply for the [facility] Adult ICU Electrolyte Replacment Protocol based on the criteria listed below:   1. Is GFR >/= 40 ml/min? yes  Patient's GFR today is >90 2. Is urine output >/= 0.5 ml/kg/hr for the last 6 hours? yes Patient's UOP is 0.6 ml/kg/hr 3. Is BUN < 60 mg/dL? yes  Patient's BUN today is 11 4. Abnormal electrolyte(s): K3.4 5. Ordered repletion with: 40meq/po 6. If a panic level lab has been reported, has the CCM MD in charge been notified? yes.   Physician:  Holland Commons MD  Melrose Nakayama 11/05/2013 5:30 AM

## 2013-11-05 NOTE — Progress Notes (Signed)
Name: Joe Molina MRN: [id number] DOB: 1956/05/18   PCP Nelwyn Salisbury, MD   ADMISSION DATE:  11/01/2013  REFERRING MD :  Effie Shy  PRIMARY SERVICE:  Pulmonary   CHIEF COMPLAINT:  Cough/fever/dyspnea   BRIEF PATIENT DESCRIPTION:   57 year old male seen by PCP on 12/15  After sudden onset fever, chills, exertional dyspnea and severe but essentially non productive cough w/ associated pleuritic type CP of the right chest first noted on 12/13 when he got up to walk the dog. He reported no obvious sick exposures. Did have his influenza vaccine this year. He did have a severe reflux episode on 12/11, which awoke him from sleep with acid taste in mouth and burning in chest. At his PCPs he was started on azithro for what was felt to be bronchitis vs possible viral process/flu. Since 15th had progressive dyspnea, right sided pleuritic CP, and some streaky hemoptysis on 12/19. Presented to ER 12/19. Had RLL infiltrate, fever, leukocytosis. Admitted to PCCM service for CAP.   SIGNIFICANT EVENTS / STUDIES:  12/15: seen by PCP placed on ZPAK after 2 days of sudden onset, fever, cough and dyspnea.  12/19: Admitted to SDU  12/20 - worse to 4L 02. Submasive PE with RV strain (normal trop and lactate but high bnp), hypertensive,  Large LLE DVT +. Rx IV heparin. IR consult - held off on cathether directed lysis.l Gives hx of  Several long car trip Fall 2014 including to [place], [place] thanksigiving 2014 followed by calf pain 12/22 Extensive discussion with patient wife, reviewed films with them.  After extensive discussion with them and conferring with other PCCM MD's decision was made to place IVC filter and proceed with systemic tPA rather than catheter directed.  Patient continues to refuse catheter directed tPA.  LINES / TUBES: PIV  CULTURES: Sputum 12/19>> neg U strep 12/19>>>neg U legionella 12/19>>> Influenza panel 12/19>>>neg Viral panel 12/19>>> PENDING MRSA PCR 12/19/1 4 -  NEG  ANTIBIOTICS: Levaquin 12/19>>>12/26 Rocephin 12/19>>>12/20  SUBJECTIVE/OVERNIGHT/INTERVAL HX  Much improved this AM from a WOB and hypoxia standpoint.  VITAL SIGNS: Temp:  [98.8 F (37.1 C)-101.3 F (38.5 C)] 98.8 F (37.1 C) (12/23 0800) Pulse Rate:  [84-106] 95 (12/23 0600) Resp:  [16-32] 20 (12/23 0600) BP: (134-164)/(83-104) 153/90 mmHg (12/23 0600) SpO2:  [90 %-99 %] 95 % (12/23 0600) Weight:  [93.7 kg (206 lb 9.1 oz)] 93.7 kg (206 lb 9.1 oz) (12/23 0357)  PHYSICAL EXAMINATION: General: Well appearing, NAD. Neuro:  A&O, non-focal  . Moves all 4s. HEENT:  AT/[place], MMM, PERRL, EOM Cardiovascular: rrr Lungs: RLL crackles. Abdomen:  Soft, non-tender, + bowel sounds  Musculoskeletal:  Intact  Skin: Intact   PULMONARY  Recent Labs Lab 11/01/13 0800  PHART 7.532*  PCO2ART 25.6*  PO2ART 68.1*  HCO3 21.4  TCO2 18.6  O2SAT 95.0   CBC  Recent Labs Lab 11/04/13 0401 11/04/13 1435 11/05/13 0350  HGB 11.5* 12.1* 11.3*  HCT 31.9* 33.4* 31.7*  WBC 9.6 10.3 9.0  PLT 214 186 209   COAGULATION  Recent Labs Lab 11/02/13 0841 11/04/13 1435  INR 1.25 1.49   CARDIAC   Recent Labs Lab 11/01/13 0745 11/02/13 0841 11/04/13 0401  TROPONINI <0.30 <0.30 <0.30   Recent Labs Lab 11/02/13 0440  PROBNP 2386.0*   CHEMISTRY  Recent Labs Lab 11/01/13 0745 11/02/13 0440 11/02/13 0841 11/02/13 1700 11/03/13 0402 11/04/13 0401 11/05/13 0350  NA 129* 132*  --  127*  --  130* 131*  K 3.8  3.9  --  3.4*  --  2.9* 3.4*  CL 97 97  --  95*  --  95* 98  CO2 20 22  --  23  --  23 22  GLUCOSE 191* 102*  --  116*  --  129* 106*  BUN 15 12  --  10  --  7 11  CREATININE 0.86 0.91  --  0.85  --  0.77 0.76  CALCIUM 8.7 8.3*  --  8.0*  --  8.0* 8.1*  MG  --   --  1.8  --  2.0 2.1 2.1  PHOS  --   --  1.6*  --  2.2* 2.5 2.5   Estimated Creatinine Clearance: 115.7 ml/min (by C-G formula based on Cr of 0.76).  LIVER  Recent Labs Lab 11/02/13 0841 11/04/13 1435   AST 31  --   ALT 39  --   ALKPHOS 52  --   BILITOT 1.8*  --   PROT 6.0  --   ALBUMIN 2.7*  --   INR 1.25 1.49   INFECTIOUS  Recent Labs Lab 11/01/13 0745 11/02/13 0500 11/02/13 0841 11/03/13 0403 11/03/13 0405 11/04/13 0401  LATICACIDVEN  --   --  1.4  --  1.2 1.4  PROCALCITON 0.11 0.30  --  0.40  --   --    ENDOCRINE CBG (last 3)   Recent Labs  11/02/13 2334 11/03/13 0402 11/03/13 0759  GLUCAP 112* 111* 117*   IMAGING x48h  Ir Ivc Filter Plmt / S&i /img Guid/mod Sed  11/04/2013   CLINICAL DATA:  Acute sub massive pulmonary embolus, left lower extremity DVT  EXAM: ULTRASOUND GUIDANCE FOR VASCULAR ACCESS  IVC CATHETERIZATION AND VENOGRAM  IVC FILTER INSERTION  Date:  12/22/201412/22/2014 11:56 AM  Radiologist:  Judie Petit. Ruel Favors, MD  Guidance:  Ultrasound fluoroscopic  MEDICATIONS AND MEDICAL HISTORY: 50 mcg fentanyl  CONTRAST:  45mL OMNIPAQUE IOHEXOL 300 MG/ML  SOLN  FLUOROSCOPY TIME:  1 min 12 seconds  PROCEDURE: Informed consent was obtained from the patient following explanation of the procedure, risks, benefits and alternatives. The patient understands, agrees and consents for the procedure. All questions were addressed. A time out was performed.  Maximal barrier sterile technique utilized including caps, mask, sterile gowns, sterile gloves, large sterile drape, hand hygiene, and betadine prep.  Under sterile condition and local anesthesia, right internal jugular venous access was performed with ultrasound. Over a guide wire, the IVC filter delivery sheath and inner dilator were advanced into the IVC just above the IVC bifurcation. Contrast injection was performed for an IVC venogram.  IVC VENOGRAM: The IVC is patent. No evidence of thrombus, stenosis, or occlusion. No variant venous anatomy. The renal veins are identified at L1.  IVC FILTER INSERTION: Through the delivery sheath, the Bard Denali IVC filter was deployed in the infrarenal IVC at the L2-3 level just below the  renal veins and above the IVC bifurcation. Contrast injection confirmed position. There is good apposition of the filter against the IVC.  The delivery sheath was removed and hemostasis was obtained with compression for 5 minutes. The patient tolerated the procedure well. No immediate complications.  COMPLICATIONS: No immediate  IMPRESSION: Ultrasound and fluoroscopically guided infrarenal IVC filter insertion.   Electronically Signed   By: Ruel Favors M.D.   On: 11/04/2013 12:44   ASSESSMENT / PLAN:  PULMONARY A:Acute resp failure to RLL infectiious process - aspiration v CAP without specific etiology. Worsening o2 need 12/20. SEver cough  12/22 see discussion above. P:   - Titrate o2 for sats. - IS. - Begin Xeralto today and anticipate discharge in AM.  CARDIOVASCULAR A: Sinus tach and hypertension P:  - Continue scheduled norvasc. - Low dose beta blockers as ordered.  RENAL A: Electrolytes concerns. P:   - IVF as ordered. - Replace electrolytes as needed.  GASTROINTESTINAL A:   Severe acid reflyux for days preceding illness. s  11/03/13 - appetite better P:   - Regular diet. - D/C protonix. - D/C pepcid.  HEMATOLOGIC A:  Anemia of critical illness resolved.  Lupus anticoag positive. P:  - PRBC for hgb </= 6.9gm%    - exceptions are   -  if ACS susepcted/confirmed then transfuse for hgb </= 8.0gm%,  or    -   active bleeding with hemodynamic instability, then transfuse regardless of hemoglobin value   At at all times try to transfuse 1 unit prbc as possible with exception of active hemorrhage - Heparin SQ bid for dvt proph - Xaralto for anticoag likely for life. - Will run by H/O prior to discharge.  INFECTIOUS A:  Doubt RLL CAP 11/03/13 0- Currently on levaquin  P:   - Change to po levaquin once taking PO and finish an 8 day course, last dose on 12/26.  ENDOCRINE A:  Metabolic syndrome P:   - Phase1 hyperglycemia.  NEUROLOGIC A:  Intact.  11/03/13 -  denies pleurittic pain  P:   - PRN fentanyl for pain.  TODAY'S SUMMARY:  Discussed with patient and wife, after discussion decided to start xaralto today, plan discussed with pharmacy.  Patient is lupus anticoagulant positive but after conferring with H/O the results could be false positive and will need to be repeated in 3 months while on xaralto, will not commit for life for now to anticoagulation.  Alyson Reedy, M.D. [facility] Pulmonary/Critical Care Medicine. Pager: [phone]. After hours pager: [phone].

## 2013-11-05 NOTE — Progress Notes (Signed)
Came to visit patient at bedside on behalf of Link to [employer] program for [employer] employees/dependents with [employer]. States he would be appreciative of follow up phone call to be sure he is doing well at home post hospital discharge. Confirmed best contact number. States Monday (11/11/13) will be okay time to call him at home. Left contact information and Link to Wellness brochure at bedside.  Raiford Noble, MSN-Ed, RN, BSN- [facility] [phone]

## 2013-11-06 ENCOUNTER — Inpatient Hospital Stay (HOSPITAL_COMMUNITY): Payer: 59

## 2013-11-06 LAB — CBC WITH DIFFERENTIAL/PLATELET
Basophils Absolute: 0 10*3/uL (ref 0.0–0.1)
Eosinophils Relative: 4 % (ref 0–5)
Hemoglobin: 12.1 g/dL — ABNORMAL LOW (ref 13.0–17.0)
Lymphocytes Relative: 10 % — ABNORMAL LOW (ref 12–46)
Lymphs Abs: 0.9 10*3/uL (ref 0.7–4.0)
MCH: 32.4 pg (ref 26.0–34.0)
MCV: 91.4 fL (ref 78.0–100.0)
Monocytes Relative: 13 % — ABNORMAL HIGH (ref 3–12)
Neutro Abs: 6.1 10*3/uL (ref 1.7–7.7)
Neutrophils Relative %: 72 % (ref 43–77)
Platelets: 266 10*3/uL (ref 150–400)
RDW: 11.3 % — ABNORMAL LOW (ref 11.5–15.5)
WBC: 8.5 10*3/uL (ref 4.0–10.5)

## 2013-11-06 LAB — BASIC METABOLIC PANEL
Calcium: 8.4 mg/dL (ref 8.4–10.5)
Creatinine, Ser: 0.79 mg/dL (ref 0.50–1.35)
GFR calc Af Amer: >90 mL/min (ref >90)
GFR calc non Af Amer: >90 mL/min (ref >90)
Glucose, Bld: 104 mg/dL — ABNORMAL HIGH (ref 70–99)
Potassium: 4 mEq/L (ref 3.5–5.1)
Sodium: 129 mEq/L — ABNORMAL LOW (ref 135–145)

## 2013-11-06 LAB — PHOSPHORUS: Phosphorus: 3.4 mg/dL (ref 2.3–4.6)

## 2013-11-06 LAB — MAGNESIUM: Magnesium: 2.2 mg/dL (ref 1.5–2.5)

## 2013-11-06 MED ORDER — RIVAROXABAN (XARELTO) VTE STARTER PACK (15 & 20 MG): ORAL_TABLET | ORAL | Status: DC

## 2013-11-06 MED ORDER — LEVOFLOXACIN 750 MG PO TABS: 750.0000 mg | ORAL_TABLET | Freq: Every day | ORAL | Status: DC

## 2013-11-06 NOTE — Progress Notes (Signed)
Morning medications administered to patient, including antihypertensive meds and antibiotic.  Reviewed Discharge Summary, no questions at this time.  Patient to follow up with MD as recommended.  Pt escorted out of hospital in wheelchair.  Wife, Waynetta Sandy driving home.  Ok Edwards, RN

## 2013-11-06 NOTE — Discharge Summary (Signed)
Physician Discharge Summary  Patient ID: Joe Molina MRN: [id number] DOB/AGE: May 25, 1956 57 y.o.  Admit date: 11/01/2013 Discharge date: 11/06/2013  Problem List Active Problems:   CAP (community acquired pneumonia)   Acute respiratory failure with hypoxia   Sepsis(995.91)   Subacute massive pulmonary embolism   DVT (deep venous thrombosis)   Acute pulmonary embolism  HPI: 57 year old male seen by PCP on 12/15 After sudden onset fever, chills, exertional dyspnea and severe but essentially non productive cough w/ associated pleuritic type CP of the right chest first noted on 12/13 when he got up to walk the dog. He reported no obvious sick exposures. Did have his influenza vaccine this year. He did have a severe reflux episode on 12/11, which awoke him from sleep with acid taste in mouth and burning in chest. At his PCPs he was started on azithro for what was felt to be bronchitis vs possible viral process/flu. Since 15th had progressive dyspnea, right sided pleuritic CP, and some streaky hemoptysis on 12/19. Presented to ER 12/19. Had RLL infiltrate, fever, leukocytosis. Admitted to PCCM service for CAP.   Hospital Course: SIGNIFICANT EVENTS / STUDIES:  12/15: seen by PCP placed on ZPAK after 2 days of sudden onset, fever, cough and dyspnea.  12/19: Admitted to SDU  12/20 - worse to 4L 02. Submasive PE with RV strain (normal trop and lactate but high bnp), hypertensive, Large LLE DVT +. Rx IV heparin. IR consult - held off on cathether directed lysis.l Gives hx of Several long car trip Fall 2014 including to [place], [place] thanksigiving 2014 followed by calf pain  12/22 Extensive discussion with patient wife, reviewed films with them. After extensive discussion with them and conferring with other PCCM MD's decision was made to place IVC filter and proceed with systemic tPA rather than catheter directed. Patient continues to refuse catheter directed tPA.  LINES / TUBES:  PIV  CULTURES:   Sputum 12/19>> neg  U strep 12/19>>>neg  U legionella 12/19>>>  Influenza panel 12/19>>>neg  Viral panel 12/19>>> PENDING  MRSA PCR 12/19/1 4 - NEG  ANTIBIOTICS:  Levaquin 12/19>>>12/26  Rocephin 12/19>>>12/20   ASSESSMENT / PLAN:  PULMONARY  A:Acute resp failure to RLL infectiious process - aspiration v CAP without specific etiology. Worsening o2 need 12/20. SEver cough  12/22 see discussion above.  P:  - Titrate o2 for sats.  - IS.  - Begin Xeralto 12-23 and anticipate discharge in 12-24.  CARDIOVASCULAR  A: Sinus tach and hypertension  P:  - Continue scheduled norvasc.  - Low dose beta blockers as ordered.  RENAL  A: Electrolytes concerns.  P:  - IVF as ordered.  - Replace electrolytes as needed.  GASTROINTESTINAL  A: Severe acid reflyux for days preceding illness. 11/03/13 - appetite better  P:  - Regular diet.  - D/C protonix.  - D/C pepcid.  HEMATOLOGIC  A: Anemia of critical illness resolved. Lupus anticoag positive.  P:  - PRBC for hgb </= 6.9gm%  - Heparin SQ bid for dvt proph  - Xaralto for anticoagulation and hematology follow up. - He wants to follow up with Dr. Darnelle Catalan as an outpatient. Lupus anticoagulant ++. INFECTIOUS  A: Doubt RLL CAP  11/03/13 0- Currently on levaquin  P:  - Change to po levaquin once taking PO and finish an 8 day course, last dose on 12/26.  ENDOCRINE  A: Metabolic syndrome  P:  - Phase1 hyperglycemia.  NEUROLOGIC  A: Intact.  11/03/13 - denies pleurittic  pain  P:  - PRN fentanyl for pain.      Labs at discharge Lab Results  Component Value Date   CREATININE 0.79 11/06/2013   BUN 11 11/06/2013   NA 129* 11/06/2013   K 4.0 11/06/2013   CL 97 11/06/2013   CO2 22 11/06/2013   Lab Results  Component Value Date   WBC 8.5 11/06/2013   HGB 12.1* 11/06/2013   HCT 34.1* 11/06/2013   MCV 91.4 11/06/2013   PLT 266 11/06/2013   Lab Results  Component Value Date   ALT 39 11/02/2013   AST 31 11/02/2013    ALKPHOS 52 11/02/2013   BILITOT 1.8* 11/02/2013   Lab Results  Component Value Date   INR 1.49 11/04/2013   INR 1.25 11/02/2013    Current radiology studies Ir Ivc Filter Plmt / S&i /img Guid/mod Sed  11/04/2013   CLINICAL DATA:  Acute sub massive pulmonary embolus, left lower extremity DVT  EXAM: ULTRASOUND GUIDANCE FOR VASCULAR ACCESS  IVC CATHETERIZATION AND VENOGRAM  IVC FILTER INSERTION  Date:  12/22/201412/22/2014 11:56 AM  Radiologist:  Judie Petit. Ruel Favors, MD  Guidance:  Ultrasound fluoroscopic  MEDICATIONS AND MEDICAL HISTORY: 50 mcg fentanyl  CONTRAST:  45mL OMNIPAQUE IOHEXOL 300 MG/ML  SOLN  FLUOROSCOPY TIME:  1 min 12 seconds  PROCEDURE: Informed consent was obtained from the patient following explanation of the procedure, risks, benefits and alternatives. The patient understands, agrees and consents for the procedure. All questions were addressed. A time out was performed.  Maximal barrier sterile technique utilized including caps, mask, sterile gowns, sterile gloves, large sterile drape, hand hygiene, and betadine prep.  Under sterile condition and local anesthesia, right internal jugular venous access was performed with ultrasound. Over a guide wire, the IVC filter delivery sheath and inner dilator were advanced into the IVC just above the IVC bifurcation. Contrast injection was performed for an IVC venogram.  IVC VENOGRAM: The IVC is patent. No evidence of thrombus, stenosis, or occlusion. No variant venous anatomy. The renal veins are identified at L1.  IVC FILTER INSERTION: Through the delivery sheath, the Bard Denali IVC filter was deployed in the infrarenal IVC at the L2-3 level just below the renal veins and above the IVC bifurcation. Contrast injection confirmed position. There is good apposition of the filter against the IVC.  The delivery sheath was removed and hemostasis was obtained with compression for 5 minutes. The patient tolerated the procedure well. No immediate complications.   COMPLICATIONS: No immediate  IMPRESSION: Ultrasound and fluoroscopically guided infrarenal IVC filter insertion.   Electronically Signed   By: Ruel Favors M.D.   On: 11/04/2013 12:44   Dg Chest Port 1 View  11/06/2013   CLINICAL DATA:  Hypoxemia  EXAM: PORTABLE CHEST - 1 VIEW  COMPARISON:  11/02/2013  FINDINGS: Cardiac shadow is stable. The lungs are well aerated bilaterally with the exception of the persistent right lower lobe infiltrate likely related to the patient's known pulmonary emboli. No new focal abnormality is seen.  IMPRESSION: Stable appearance of the chest when compared with the prior exam.   Electronically Signed   By: Alcide Clever M.D.   On: 11/06/2013 07:46    Disposition:  01-Home or Self Care   Future Appointments Provider Department Dept Phone   11/12/2013 8:30 AM Shanda Bumps D. Rondall Allegra [facility] [phone]   11/18/2013 10:00 AM Storm Frisk, MD  Pulmonary Care [phone]       Medication List    STOP  taking these medications       azithromycin 250 MG tablet  Commonly known as:  ZITHROMAX      TAKE these medications       FISH OIL PO  Take 1 tablet by mouth daily.     HYDROcodone-homatropine 5-1.5 MG/5ML syrup  Commonly known as:  HYDROMET  Take 5 mLs by mouth every 4 (four) hours as needed for cough.     levofloxacin 750 MG tablet  Commonly known as:  LEVAQUIN  Take 1 tablet (750 mg total) by mouth daily.     lisinopril 10 MG tablet  Commonly known as:  PRINIVIL,ZESTRIL  Take 1 tablet (10 mg total) by mouth daily.     multivitamin tablet  Take 1 tablet by mouth daily. 50 plus     pantoprazole 40 MG tablet  Commonly known as:  PROTONIX  Take 1 tablet (40 mg total) by mouth daily.     Red Yeast Rice 600 MG Caps  Take 1 capsule by mouth daily.     Rivaroxaban 15 & 20 MG Tbpk  Commonly known as:  XARELTO STARTER PACK  Take as directed on package: Start with one 15mg  tablet by mouth twice a day with food. On  Day 22, switch to one 20mg  tablet once a day with food.          Discharged Condition: good  Time spent on discharge greater than 40 minutes.  Vital signs at Discharge. Temp:  [98.8 F (37.1 C)-100.4 F (38 C)] 99 F (37.2 C) (12/24 0400) Pulse Rate:  [82-93] 86 (12/23 2200) Resp:  [18-32] 20 (12/24 0747) BP: (135-169)/(79-104) 160/92 mmHg (12/24 0747) SpO2:  [88 %-98 %] 95 % (12/24 0747) Weight:  [213 lb 3 oz (96.7 kg)] 213 lb 3 oz (96.7 kg) (12/24 0400) Office follow up Special Information or instructions. He will need IVC filter removed in four weeks. He wants to follow up with up with Dr. Ruthann Cancer for hematology follow up.  He will resume previous antihypertensives. Signed: Brett Canales Minor ACNP Adolph Pollack PCCM Pager [phone] till 3 pm If no answer page [phone] 11/06/2013, 9:37 AM  Patient seen and examined, agree with above note.  I dictated the care and orders written for this patient under my direction.  Alyson Reedy, MD [phone]

## 2013-11-07 LAB — CULTURE, BLOOD (ROUTINE X 2): Culture: NO GROWTH

## 2013-11-12 ENCOUNTER — Ambulatory Visit: Payer: 59 | Admitting: Gastroenterology

## 2013-11-18 ENCOUNTER — Other Ambulatory Visit: Payer: Self-pay | Admitting: Oncology

## 2013-11-18 ENCOUNTER — Telehealth: Payer: Self-pay | Admitting: Oncology

## 2013-11-18 ENCOUNTER — Encounter: Payer: Self-pay | Admitting: Critical Care Medicine

## 2013-11-18 ENCOUNTER — Ambulatory Visit (INDEPENDENT_AMBULATORY_CARE_PROVIDER_SITE_OTHER)
Admission: RE | Admit: 2013-11-18 | Discharge: 2013-11-18 | Disposition: A | Discharge status: Home / Self Care | Payer: 59 | Source: Ambulatory Visit | Attending: Critical Care Medicine | Admitting: Critical Care Medicine

## 2013-11-18 ENCOUNTER — Ambulatory Visit (INDEPENDENT_AMBULATORY_CARE_PROVIDER_SITE_OTHER): Payer: 59 | Admitting: Critical Care Medicine

## 2013-11-18 VITALS — BP 114/66 | HR 76 | Temp 97.5°F | Ht 70.0 in | Wt 198.0 lb

## 2013-11-18 DIAGNOSIS — I82409 Acute embolism and thrombosis of unspecified deep veins of unspecified lower extremity: Secondary | ICD-10-CM

## 2013-11-18 DIAGNOSIS — I8290 Acute embolism and thrombosis of unspecified vein: Secondary | ICD-10-CM

## 2013-11-18 DIAGNOSIS — I2699 Other pulmonary embolism without acute cor pulmonale: Secondary | ICD-10-CM

## 2013-11-18 DIAGNOSIS — I749 Embolism and thrombosis of unspecified artery: Secondary | ICD-10-CM

## 2013-11-18 DIAGNOSIS — J189 Pneumonia, unspecified organism: Secondary | ICD-10-CM

## 2013-11-18 DIAGNOSIS — I82402 Acute embolism and thrombosis of unspecified deep veins of left lower extremity: Secondary | ICD-10-CM

## 2013-11-18 MED ORDER — HYDROCODONE-HOMATROPINE 5-1.5 MG/5ML PO SYRP: 5.0000 mL | ORAL_SOLUTION | ORAL | Status: DC | PRN

## 2013-11-18 NOTE — Telephone Encounter (Signed)
C/D 11/18/13 for appt. 11/28/13

## 2013-11-18 NOTE — Patient Instructions (Addendum)
Stay on Xarelto We will arrange for filter removal in two weeks, the pt prefers this NOT occur on Wednesday A venous doppler ultrasound will be obtained A chest xray will be obtained Return next week for lab work You will be referred to hematology Dr Jana Hakim You will be cleared for colonoscopy and endoscopy in three weeks Return to work ok 11/25/13 Return Dr Joya Gaskins 6 weeks

## 2013-11-18 NOTE — Progress Notes (Signed)
Subjective:    Patient ID: Joe Molina, male    DOB: 10/17/1956, 58 y.o.   MRN: [id number]  HPI 11/18/2013 Chief Complaint  Patient presents with  . Hospitalization Follow-up    Pt following up from b/l clots in lungs.  Pt c/o occ. nonprod cough, no other complaints with his breathing.    This patient was admitted to [facility] long hospital on 11/01/2013 for presumed right lower lobe pneumonia. The patient had previously been given a course of azithromycin on 10/28/2013 for a 2 day history of sudden onset fever cough and dyspnea. The patient had associated hemoptysis. Subsequent to the admission the patient was worsening with hypoxemia on 11/02/2013 and found to have a submassive pulmonary embolism with right ventricular strain elevated BNP. The patient was also found to have a large left lower sternum a deep venous thrombosis up to the common femoral vein. This patient subsequently underwent on 11/04/2013 and inferior indicated the filter placement and systemic TPA. The patient started on a heparin drip per 24 hours prior to this procedure. The patient was found to have a large mobile clot in the left common femoral vein and this is the genesis for the inferior vena cava filter. The patient was subsequently transitioned over to oral xarelto.  The patient was subsequently discharged home on 11/06/2013. Since discharge the patient has increased ambulation. There is no chest pain. There is no further hemoptysis. There is minimal cough. The patient's dyspnea is markedly improved. There's been no excess bleeding. Note a hypercoagulable study was not obtained during this hospitalization. The patient has a scheduled colonoscopy and endoscopy upcoming. Note the patient has a history of deep venous thrombosis in his mother. No other positive family history for hypercoagulable state.  Pt denies any significant sore throat, nasal congestion or excess secretions, fever, chills, sweats, unintended weight loss,  pleurtic or exertional chest pain, orthopnea PND, or leg swelling Pt denies any increase in rescue therapy over baseline, denies waking up needing it or having any early am or nocturnal exacerbations of coughing/wheezing/or dyspnea. Pt also denies any obvious fluctuation in symptoms with  weather or environmental change or other alleviating or aggravating factors   Past Medical History  Diagnosis Date  . Hypertension   . Hyperlipidemia   . GERD (gastroesophageal reflux disease)   . Personal history of colonic adenomas 10/19/2010     Family History  Problem Relation Age of Onset  . Heart disease      male 1st degree relative at 59 yo     History   Social History  . Marital Status: Married    Spouse Name: N/A    Number of Children: N/A  . Years of Education: N/A   Occupational History  . Not on file.   Social History Main Topics  . Smoking status: Never Smoker   . Smokeless tobacco: Never Used  . Alcohol Use: 3.5 oz/week    7 drink(s) per week     Comment: 5 drinks per week - 11/01/13  . Drug Use: No  . Sexual Activity: Not on file   Other Topics Concern  . Not on file   Social History Narrative   Occupation: Engineer, materials for [employer]   Married     Allergies  Allergen Reactions  . Codeine Sulfate     REACTION: nausea     Outpatient Prescriptions Prior to Visit  Medication Sig Dispense Refill  . lisinopril (PRINIVIL,ZESTRIL) 10 MG tablet Take  1 tablet (10 mg total) by mouth daily.  90 tablet  3  . Multiple Vitamin (MULTIVITAMIN) tablet Take 1 tablet by mouth daily. 50 plus      . Omega-3 Fatty Acids (FISH OIL PO) Take 1 tablet by mouth daily.        . pantoprazole (PROTONIX) 40 MG tablet Take 1 tablet (40 mg total) by mouth daily.  90 tablet  3  . Red Yeast Rice 600 MG CAPS Take 1 capsule by mouth daily.        . Rivaroxaban (XARELTO STARTER PACK) 15 & 20 MG TBPK Take as directed on package: Start with one 15mg  tablet by mouth  twice a day with food. On Day 22, switch to one 20mg  tablet once a day with food.  51 each  1  . HYDROcodone-homatropine (HYDROMET) 5-1.5 MG/5ML syrup Take 5 mLs by mouth every 4 (four) hours as needed for cough.  240 mL  0  . levofloxacin (LEVAQUIN) 750 MG tablet Take 1 tablet (750 mg total) by mouth daily.  2 tablet  0   No facility-administered medications prior to visit.     Review of Systems Constitutional:   No  weight loss, night sweats,  Fevers, chills, fatigue, lassitude. HEENT:   No headaches,  Difficulty swallowing,  Tooth/dental problems,  Sore throat,                No sneezing, itching, ear ache, nasal congestion, post nasal drip,   CV:  No chest pain,  Orthopnea, PND, swelling in lower extremities, anasarca, dizziness, palpitations  GI  No heartburn, indigestion, abdominal pain, nausea, vomiting, diarrhea, change in bowel habits, loss of appetite  Resp: No shortness of breath with exertion or at rest.  No excess mucus, no productive cough, ++ non-productive cough,  No coughing up of blood.  No change in color of mucus.  No wheezing.  No chest wall deformity  Skin: no rash or lesions.  GU: no dysuria, change in color of urine, no urgency or frequency.  No flank pain.  MS:  No joint pain or swelling.  No decreased range of motion.  No back pain.  Psych:  No change in mood or affect. No depression or anxiety.  No memory loss.      Objective:   Physical Exam Filed Vitals:   11/18/13 1013  BP: 114/66  Pulse: 76  Temp: 97.5 F (36.4 C)  TempSrc: Oral  Height: 5\' 10"  (1.778 m)  Weight: 198 lb (89.812 kg)  SpO2: 97%    Gen: Pleasant, well-nourished, in no distress,  normal affect  ENT: No lesions,  mouth clear,  oropharynx clear, no postnasal drip  Neck: No JVD, no TMG, no carotid bruits  Lungs: No use of accessory muscles, no dullness to percussion, clear without rales or rhonchi  Cardiovascular: RRR, heart sounds normal, no murmur or gallops, min  peripheral  edema in LLE  Abdomen: soft and NT, no HSM,  BS normal  Musculoskeletal: No deformities, no cyanosis or clubbing  Neuro: alert, non focal  Skin: Warm, no lesions or rashes  Dg Chest 2 View  11/18/2013   CLINICAL DATA:  History of pulmonary embolism  EXAM: CHEST  2 VIEW  COMPARISON:  11/06/2013  FINDINGS: Cardiac shadow is within normal limits. The lungs are well aerated bilaterally. Some very mild persistent changes are noted in the right lung base stable from the prior exam. This is likely sequela from the known pulmonary embolism. No new focal  abnormality is seen.  IMPRESSION: Stable changes in the right lateral lung base.   Electronically Signed   By: Inez Catalina M.D.   On: 11/18/2013 12:02      Assessment & Plan:   Subacute massive pulmonary embolism Acute pulmonary embolism in the right lower lobe with infarction. Associated major DVT LLE with large clot burden LLE Moderate right ventricular overload S/p TPA plus IVC filter placement into IVC, retrievable Improved with xarelto in follow up to TPA Plan Needs hypercoaguable panel drawn in one week Plan filter retrieval in two weeks Plan venous doppler u/s in one week Cleared for planned colonoscopy and EGD in three weeks Cont xarelto, do not want to remove filter or do any procedures until past the 21 day period of 15mg  bid, wait until get to 20mg  daily dosing Referral to hematology Dr Griffith Citron Rov 1 month Pulm office  DVT (deep venous thrombosis) Large LLE clot burden See PE assessment  CAP (community acquired pneumonia) Questionable RLL CAP, now resolved on CXR 11/18/13 Off all ABX   Updated Medication List Outpatient Encounter Prescriptions as of 11/18/2013  Medication Sig  . HYDROcodone-homatropine (HYDROMET) 5-1.5 MG/5ML syrup Take 5 mLs by mouth every 4 (four) hours as needed for cough.  Marland Kitchen lisinopril (PRINIVIL,ZESTRIL) 10 MG tablet Take 1 tablet (10 mg total) by mouth daily.  . Multiple Vitamin (MULTIVITAMIN) tablet  Take 1 tablet by mouth daily. 50 plus  . Omega-3 Fatty Acids (FISH OIL PO) Take 1 tablet by mouth daily.    . pantoprazole (PROTONIX) 40 MG tablet Take 1 tablet (40 mg total) by mouth daily.  . Red Yeast Rice 600 MG CAPS Take 1 capsule by mouth daily.    . Rivaroxaban (XARELTO STARTER PACK) 15 & 20 MG TBPK Take as directed on package: Start with one 15mg  tablet by mouth twice a day with food. On Day 22, switch to one 20mg  tablet once a day with food.  . [DISCONTINUED] HYDROcodone-homatropine (HYDROMET) 5-1.5 MG/5ML syrup Take 5 mLs by mouth every 4 (four) hours as needed for cough.  . [DISCONTINUED] levofloxacin (LEVAQUIN) 750 MG tablet Take 1 tablet (750 mg total) by mouth daily.

## 2013-11-18 NOTE — Assessment & Plan Note (Signed)
Large LLE clot burden See PE assessment

## 2013-11-18 NOTE — Telephone Encounter (Signed)
S/w pt and gve np appt 01/15 @ 2 w/Dr. Jana Hakim.

## 2013-11-18 NOTE — Assessment & Plan Note (Signed)
Questionable RLL CAP, now resolved on CXR 11/18/13 Off all ABX

## 2013-11-18 NOTE — Telephone Encounter (Signed)
S/w pt and gve np appt 01/15 @ 2 w/Dr. Jana Hakim.  Welcome packet mailed.

## 2013-11-18 NOTE — Assessment & Plan Note (Signed)
Acute pulmonary embolism in the right lower lobe with infarction. Associated major DVT LLE with large clot burden LLE Moderate right ventricular overload S/p TPA plus IVC filter placement into IVC, retrievable Improved with xarelto in follow up to TPA Plan Needs hypercoaguable panel drawn in one week Plan filter retrieval in two weeks Plan venous doppler u/s in one week Cleared for planned colonoscopy and EGD in three weeks Cont xarelto, do not want to remove filter or do any procedures until past the 21 day period of 15mg  bid, wait until get to 20mg  daily dosing Referral to hematology Dr Griffith Citron Rov 1 month Pulm office

## 2013-11-19 ENCOUNTER — Other Ambulatory Visit: Payer: Self-pay | Admitting: Oncology

## 2013-11-20 ENCOUNTER — Other Ambulatory Visit: Payer: Self-pay | Admitting: Oncology

## 2013-11-20 ENCOUNTER — Telehealth: Payer: Self-pay | Admitting: Oncology

## 2013-11-20 DIAGNOSIS — D689 Coagulation defect, unspecified: Secondary | ICD-10-CM

## 2013-11-20 NOTE — Telephone Encounter (Signed)
S/W MR. Ground AND GAVE LAB APPT 01/12 @ 4. PATIENT CONFIRM APPT AND EMAIL WAS SENT.

## 2013-11-25 ENCOUNTER — Telehealth: Payer: Self-pay | Admitting: Internal Medicine

## 2013-11-25 ENCOUNTER — Other Ambulatory Visit: Payer: 59

## 2013-11-25 ENCOUNTER — Encounter (HOSPITAL_COMMUNITY): Payer: 59

## 2013-11-25 DIAGNOSIS — D689 Coagulation defect, unspecified: Secondary | ICD-10-CM

## 2013-11-25 NOTE — Telephone Encounter (Signed)
Change colonoscopy recall - delay for 6 months given PE and anticoag Tx

## 2013-11-25 NOTE — Telephone Encounter (Signed)
Recall updated

## 2013-11-28 ENCOUNTER — Ambulatory Visit (HOSPITAL_BASED_OUTPATIENT_CLINIC_OR_DEPARTMENT_OTHER): Payer: 59 | Admitting: Oncology

## 2013-11-28 ENCOUNTER — Other Ambulatory Visit: Payer: Self-pay | Admitting: Oncology

## 2013-11-28 ENCOUNTER — Ambulatory Visit (HOSPITAL_COMMUNITY): Payer: 59 | Attending: Critical Care Medicine

## 2013-11-28 ENCOUNTER — Ambulatory Visit: Payer: 59

## 2013-11-28 ENCOUNTER — Encounter: Payer: Self-pay | Admitting: Oncology

## 2013-11-28 VITALS — BP 111/75 | HR 67 | Temp 98.1°F | Resp 18 | Ht 70.0 in | Wt 192.1 lb

## 2013-11-28 DIAGNOSIS — I803 Phlebitis and thrombophlebitis of lower extremities, unspecified: Secondary | ICD-10-CM

## 2013-11-28 DIAGNOSIS — Z86718 Personal history of other venous thrombosis and embolism: Secondary | ICD-10-CM | POA: Insufficient documentation

## 2013-11-28 DIAGNOSIS — D6851 Activated protein C resistance: Secondary | ICD-10-CM | POA: Insufficient documentation

## 2013-11-28 DIAGNOSIS — D6859 Other primary thrombophilia: Secondary | ICD-10-CM

## 2013-11-28 DIAGNOSIS — D689 Coagulation defect, unspecified: Secondary | ICD-10-CM | POA: Insufficient documentation

## 2013-11-28 DIAGNOSIS — I82409 Acute embolism and thrombosis of unspecified deep veins of unspecified lower extremity: Secondary | ICD-10-CM

## 2013-11-28 DIAGNOSIS — I8290 Acute embolism and thrombosis of unspecified vein: Secondary | ICD-10-CM

## 2013-11-28 DIAGNOSIS — I2699 Other pulmonary embolism without acute cor pulmonale: Secondary | ICD-10-CM

## 2013-11-28 NOTE — Progress Notes (Signed)
Joe Molina  Telephone:([phone] [phone]     ID: Joe Molina OB: 1956-11-07  MR#: [id number]  CBJ#:[id number]  PCP: Laurey Morale, [name], Silvano Rusk. Murali Ramaswami  CHIEF COMPLAINT: "I'm glad to see you but not under these circumstances".  HISTORY OF PRESENT ILLNESS: Joe Molina did quite a bit of driving around Thanksgiving's to visit family. He tells me he was careful to stop and take walks every couple of hours. Nevertheless he developed some calf pain in December, which he paid little attention to. On 10/28/2013 he presented to his primary care physician, Dr. Sarajane Jews, complaining of what seemed like flulike symptoms. He was started on a Z-Pak. On 11/01/2013 however he felt considerably worse and presented to the emergency room with fever, cough, shortness of breath, and pleuritic chest pain. He was found to have a leukocytosis and a right lower lobe infiltrate on the chest x-ray. A preliminary diagnosis of community acquired pneumonia was made and he was admitted.  However, on the next day, 11/02/2013, he was feeling no better and Dr. Chase Caller obtained a CT angiogram which showed a large central pulmonary embolus with bilateral extension, with evidence of right heart strain (the RV: LL ratio was 1.8, with normal being less than 0.9.) Doppler ultrasound the same day documented an acute DVT in the left lower extremity. The patient was started on intravenous heparin 11/02/2013. However, within 48 hours his condition had not significantly improved and therefore, after appropriate discussion, he had an IVC filter placed and received systemic tPA with Alteplase at 100 mg IV.  Joe Molina reports that within a few hours most of his symptoms had largely resolved, and he could breathe much better. Rivaroxaban/ Xarelto was started on December 23 at the usual starting dose of 15 mg twice a day. He is tolerating it well, with no evidence of bleeding problems and  no recurrence of his symptoms. He started the 20 mg daily dose 11/28/2013.  Workup during his admission showed a possible lupus anticoagulant (that test is being repeated) more importantly, there was a factor V Leiden mutation present.  His subsequent history is as detailed below  INTERVAL HISTORY: Joe Molina was evaluated in the hematology clinic 11/28/2013. He has recovered well from his acute pulmonary embolus, and is walking 4-6 miles a day with his wife. He has also started a very aggressive diet and on his scale has lost approximately 15 pounds.  REVIEW OF SYSTEMS: He no longer has a cough, shortness of breath, or pleurisy. He only had minimal hemoptysis originally, and that has not recurred. He has not had epistaxis, blood in his stool or urine, or any bruising. He is tolerating the Rivaroxaban with no side effects that he is aware of. He has had a couple of "feeling hot" episodes, causing him to swell at night. This is not every night and it is recent period he never had significant left lower remedy swelling, although she feels he may have noticed minimal ankle swelling in December. A detailed review of systems today was otherwise entirely negative.   PAST MEDICAL HISTORY: Past Medical History  Diagnosis Date  . Hypertension   . Hyperlipidemia   . GERD (gastroesophageal reflux disease)   . Personal history of colonic adenomas 10/19/2010    PAST SURGICAL HISTORY: Past Surgical History  Procedure Laterality Date  . Cholecystectomy  06//06    Dr. Deon Pilling  . Colonscopy  10-19-10    per Dr. Senaida Ores polyps, repeat in  3 years    FAMILY HISTORY Family History  Problem Relation Age of Onset  . Heart disease      male 1st degree relative at 20 yo  His father is 95, with a history of colitis. His mother is 62, and has a history of myocardial infarction in her mid 10s. Apparently she recalls a lower extremity DVT, but no one else in the family remembers that N. she cannot provide  any further details. Nevo has one brother and one sister. 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 He is married, with 2 children: Daughter Joe Molina is graduating from college with a degree in theater, and son Joe Molina is a sophomore at [employer]: in place   HEALTH MAINTENANCE: History  Substance Use Topics  . Smoking status: Never Smoker   . Smokeless tobacco: Never Used  . Alcohol Use: 3.5 oz/week    7 drink(s) per week     Comment: 5 drinks per week - 11/01/13     Colonoscopy: pending  PSA: 1.74  Bone density:  Lipid panel:  Allergies  Allergen Reactions  . Codeine Sulfate     REACTION: nausea    Current Outpatient Prescriptions  Medication Sig Dispense Refill  . lisinopril (PRINIVIL,ZESTRIL) 10 MG tablet Take 1 tablet (10 mg total) by mouth daily.  90 tablet  3  . Multiple Vitamin (MULTIVITAMIN) tablet Take 1 tablet by mouth daily. 50 plus      . Omega-3 Fatty Acids (FISH OIL PO) Take 1 tablet by mouth daily.        . pantoprazole (PROTONIX) 40 MG tablet Take 1 tablet (40 mg total) by mouth daily.  90 tablet  3  . Red Yeast Rice 600 MG CAPS Take 1 capsule by mouth daily.        . Rivaroxaban (XARELTO STARTER PACK) 15 & 20 MG TBPK Take as directed on package: Start with one 15mg  tablet by mouth twice a day with food. On Day 22, switch to one 20mg  tablet once a day with food.  51 each  1  . HYDROcodone-homatropine (HYDROMET) 5-1.5 MG/5ML syrup Take 5 mLs by mouth every 4 (four) hours as needed for cough.  240 mL  0   No current facility-administered medications for this visit.    OBJECTIVE: Middle-aged white male appears healthy  Filed Vitals:   11/28/13 1425  BP: 111/75  Pulse: 67  Temp: 98.1 F (36.7 C)  Resp: 18     Body mass index is 27.56 kg/(m^2).    ECOG FS:1 - Symptomatic but completely ambulatory  Ocular:  Sclerae unicteric, pupils equal, round and reactive to light Lymphatic: No cervical or supraclavicular adenopathy Lungs no rales or rhonchi, no dullness, good excursion bilaterally Heart regular rate and rhythm, no murmur appreciated Abd soft, nontender, positive bowel sounds MSK no lower extremity edema Neuro: non-focal, well-oriented, appropriate affect   LAB RESULTS: 11/02/13 HETEROZYGOUS FOR FACTOR V MUTATION  Reference Interval: Negative for Factor V Mutation Interpretation: Factor V Leiden Gene Mutation (G1691A) is the most common genetic risk factor for thrombosis and accounts for 94% of individuals classified as Activated Protein C (APC) resistant. Testing for Factor V Leiden Mutation is recommended for all individuals with venous thrombosis or history of a thrombic event. Both heterozygotes and homozygotes are at risk for thrombosis, which is 5-10 fold,  and 50-100 fold respectively. DNA testing for the V R2 polymorphism is recommended for Factor V Leiden heterozygotes. Presence of this polymorphism further increases the risk of venous thrombosis in Factor V Leiden heterozygotes. DNA from this patient was tested using a FDA approved assay for Factor V Leiden. Performed at [facility]  Reference Interval: Negative for Prothrombin II Gene Mutation Interpretation: Prothrombin II 20210A Gene Mutation is a genetic risk factor resulting in an increase risk for venous thrombosis (five-fold); Myocardial infarction (two-fold); Cerebrovascular ischemic disease in young patients (especially females using oral contraceptives) and pulmonary embolism. Both heterozygous and homozygous carriers are at increased risk although homozygosity is rare. The incidence of heterozygous carriers of this mutation is estimated to be 1 to 2.5% for individuals of European and African descent. DNA from this patient was tested using a FDA approved assay for Factor II  Prothrombin Mutation. Performed at [facility] for OZIL, STETTLER (MRN [id number]) as of 11/28/2013 15:54  Ref. Range 11/25/2013 09:57  CA 19-9 Latest Range: <35.0 U/mL 14.5  CEA Latest Range: 0.0-5.0 ng/mL 0.8  PSA Latest Range: <=4.00 ng/mL 1.74  Results for LUCCA, BALLO (MRN [id number]) as of 11/28/2013 15:54   Ref. Range 11/02/2013 10:40  Anticardiolipin IgA Latest Range: <22 APL U/mL 16  Anticardiolipin IgG Latest Range: <23 GPL U/mL 2 (L)  Anticardiolipin IgM Latest Range: <11 MPL U/mL 20 (H)  PTT Lupus Anticoagulant Latest Range: 28.0-43.0 secs 47.9 (H)  PTTLA Confirmation Latest Range: <8.0 secs 15.5 (H)  PTTLA 4:1 Mix Latest Range: 28.0-43.0 secs 46.4 (H)  DRVVT Latest Range: <42.9 secs 42.6  Drvvt confirmation Latest Range: <1.11 Ratio NOT APPL  dRVVT Incubated 1:1 Mix Latest Range: <42.9 secs NOT APPL  Lupus Anticoagulant Latest Range: NOT DETECTED  DETECTED (A)  Results for TIMMEY, LAMBA (MRN [id number]) as of 11/28/2013 15:54  Ref. Range 11/02/2013 10:40  AntiThromb III Func Latest Range: 75-120 % 72 (L)    CMP     Component Value Date/Time   NA 129* 11/06/2013 0409   K 4.0 11/06/2013 0409   CL 97 11/06/2013 0409   CO2 22 11/06/2013 0409   GLUCOSE 104* 11/06/2013 0409   BUN 11 11/06/2013 0409   CREATININE 0.79 11/06/2013 0409   CALCIUM 8.4 11/06/2013 0409   PROT 6.0 11/02/2013 0841   ALBUMIN 2.7* 11/02/2013 0841   AST 31 11/02/2013 0841   ALT 39 11/02/2013 0841   ALKPHOS 52 11/02/2013 0841   BILITOT 1.8* 11/02/2013 0841   GFRNONAA >90 11/06/2013 0409   GFRAA >90 11/06/2013 0409    I No results found for this basename: SPEP,  UPEP,   kappa and lambda light chains    Lab Results  Component Value Date   WBC 8.5 11/06/2013   NEUTROABS 6.1 11/06/2013   HGB 12.1* 11/06/2013   HCT 34.1* 11/06/2013   MCV 91.4 11/06/2013   PLT 266 11/06/2013      Chemistry      Component Value Date/Time   NA 129* 11/06/2013 0409   K 4.0 11/06/2013 0409   CL  97 11/06/2013 0409   CO2 22 11/06/2013 0409   BUN 11 11/06/2013 0409   CREATININE 0.79 11/06/2013 0409      Component Value Date/Time   CALCIUM 8.4 11/06/2013 0409   ALKPHOS 52 11/02/2013 0841   AST 31 11/02/2013 0841   ALT 39 11/02/2013 0841   BILITOT 1.8* 11/02/2013 3244  No results found for this basename: LABCA2    No components found with this basename: [id number]    No results found for this basename: INR,  in the last 168 hours  Urinalysis    Component Value Date/Time   COLORURINE AMBER* 11/01/2013 0806   APPEARANCEUR CLEAR 11/01/2013 0806   LABSPEC 1.025 11/01/2013 0806   PHURINE 6.0 11/01/2013 0806   GLUCOSEU NEGATIVE 11/01/2013 0806   HGBUR NEGATIVE 11/01/2013 0806   HGBUR negative 08/04/2008 0856   BILIRUBINUR SMALL* 11/01/2013 0806   BILIRUBINUR n 11/15/2012 0912   KETONESUR 40* 11/01/2013 0806   PROTEINUR 100* 11/01/2013 0806   UROBILINOGEN 1.0 11/01/2013 0806   UROBILINOGEN 0.2 11/15/2012 0912   NITRITE NEGATIVE 11/01/2013 0806   NITRITE n 11/15/2012 0912   LEUKOCYTESUR TRACE* 11/01/2013 0806    STUDIES: Dg Chest 2 View  11/18/2013   CLINICAL DATA:  History of pulmonary embolism  EXAM: CHEST  2 VIEW  COMPARISON:  11/06/2013  FINDINGS: Cardiac shadow is within normal limits. The lungs are well aerated bilaterally. Some very mild persistent changes are noted in the right lung base stable from the prior exam. This is likely sequela from the known pulmonary embolism. No new focal abnormality is seen.  IMPRESSION: Stable changes in the right lateral lung base.   Electronically Signed   By: Inez Catalina M.D.   On: 11/18/2013 12:02   Ct Angio Chest Pe W/cm &/or Wo Cm  11/02/2013   CLINICAL DATA:  Increasing shortness of breast.  Fever.  EXAM: CT ANGIOGRAPHY CHEST WITH CONTRAST  TECHNIQUE: Multidetector CT imaging of the chest was performed using the standard protocol during bolus administration of intravenous contrast. Multiplanar CT image reconstructions including  MIPs were obtained to evaluate the vascular anatomy.  CONTRAST:  124mL OMNIPAQUE IOHEXOL 350 MG/ML SOLN  COMPARISON:  CHEST x-ray 11/02/2013  FINDINGS: There are large central pulmonary emboli with extension into both upper and lower lobes and likely into the right middle lobe as well. The largest clot burden is in the lower lobes bilaterally, right greater than left. Associated airspace disease within the right lower lobe with small right pleural effusion. This could be related to early pulmonary infarct/hemorrhage. There is evidence of right heart strain with elevated RV:LV ratio of 1.8 (normal is less than 0.9). Heart is mildly enlarged. Aorta is normal caliber. No mediastinal, hilar, or axillary adenopathy. Chest wall soft tissues are unremarkable. Imaging into the upper abdomen shows no acute findings.  No acute bony abnormality. Sclerotic focus within a mid thoracic vertebral body likely reflects a bone island.  Review of the MIP images confirms the above findings.  IMPRESSION: Positive for acute PE with CT evidence of right heart strain (RV/LV Ratio = 1.8) consistent with at least submassive (intermediate risk)PE. The presence of right heart strain has been associated with an increased risk of morbidity and mortality. Consultation with Pulmonary and Critical Care Medicine is recommended.  Airspace disease within the right lower lung could reflect pulmonary infarcts/hemorrhage. Small right pleural effusion.  Critical Value/emergent results were called by telephone at the time of interpretation on 11/02/2013 at 9:35 AM to Dr. Brand Males , who verbally acknowledged these results.   Electronically Signed   By: Rolm Baptise M.D.   On: 11/02/2013 09:35   Ir Ivc Filter Plmt / S&i /img Guid/mod Sed  11/04/2013   CLINICAL DATA:  Acute sub massive pulmonary embolus, left lower extremity DVT  EXAM: ULTRASOUND GUIDANCE FOR VASCULAR ACCESS  IVC CATHETERIZATION AND  VENOGRAM  IVC FILTER INSERTION  Date:   12/22/201412/22/2014 11:56 AM  Radiologist:  Jerilynn Mages. Daryll Brod, MD  Guidance:  Ultrasound fluoroscopic  MEDICATIONS AND MEDICAL HISTORY: 50 mcg fentanyl  CONTRAST:  18mL OMNIPAQUE IOHEXOL 300 MG/ML  SOLN  FLUOROSCOPY TIME:  1 min 12 seconds  PROCEDURE: Informed consent was obtained from the patient following explanation of the procedure, risks, benefits and alternatives. The patient understands, agrees and consents for the procedure. All questions were addressed. A time out was performed.  Maximal barrier sterile technique utilized including caps, mask, sterile gowns, sterile gloves, large sterile drape, hand hygiene, and betadine prep.  Under sterile condition and local anesthesia, right internal jugular venous access was performed with ultrasound. Over a guide wire, the IVC filter delivery sheath and inner dilator were advanced into the IVC just above the IVC bifurcation. Contrast injection was performed for an IVC venogram.  IVC VENOGRAM: The IVC is patent. No evidence of thrombus, stenosis, or occlusion. No variant venous anatomy. The renal veins are identified at L1.  IVC FILTER INSERTION: Through the delivery sheath, the Bard Denali IVC filter was deployed in the infrarenal IVC at the L2-3 level just below the renal veins and above the IVC bifurcation. Contrast injection confirmed position. There is good apposition of the filter against the IVC.  The delivery sheath was removed and hemostasis was obtained with compression for 5 minutes. The patient tolerated the procedure well. No immediate complications.  COMPLICATIONS: No immediate  IMPRESSION: Ultrasound and fluoroscopically guided infrarenal IVC filter insertion.   Electronically Signed   By: Daryll Brod M.D.   On: 11/04/2013 12:44   Dg Chest Port 1 View  11/06/2013   CLINICAL DATA:  Hypoxemia  EXAM: PORTABLE CHEST - 1 VIEW  COMPARISON:  11/02/2013  FINDINGS: Cardiac shadow is stable. The lungs are well aerated bilaterally with the exception of the  persistent right lower lobe infiltrate likely related to the patient's known pulmonary emboli. No new focal abnormality is seen.  IMPRESSION: Stable appearance of the chest when compared with the prior exam.   Electronically Signed   By: Inez Catalina M.D.   On: 11/06/2013 07:46   Dg Chest Port 1 View  11/02/2013   CLINICAL DATA:  Followup right-sided pneumonia  EXAM: PORTABLE CHEST - 1 VIEW  COMPARISON:  11/01/2013  FINDINGS: Hazy infiltrate at the right base appear similar in extent. Given history of fever and cough, this is most likely a pneumonia -rather than infarct. There is evidence of a small right pleural effusion. No new opacity. Borderline cardiomegaly, no edema.  IMPRESSION: New small pleural effusion around the persistent right base infiltrate.   Electronically Signed   By: Jorje Guild M.D.   On: 11/02/2013 06:12   Dg Chest Port 1 View  11/01/2013   CLINICAL DATA:  Cough and fever, hemoptysis  EXAM: PORTABLE CHEST - 1 VIEW  COMPARISON:  None.  FINDINGS: Cardiac shadow is within normal limits. The left lung is clear. Right basilar infiltrate is noted laterally. No other focal abnormality is noted.  IMPRESSION: Right basilar infiltrate.   Electronically Signed   By: Inez Catalina M.D.   On: 11/01/2013 07:58    ASSESSMENT: 58 y.o. with a history of submassive pulmonary embolus with heart strain diagnosed 11/02/2013 and left lower extremity DVT diagnosed the same day, found to be heterozygous for the factor V Leiden mutation  (1) acute treatment consisted of:  (a) intravenous heparin started 11/02/2013, discontinued 11/05/2013  (b) IVC filter  placed 11/04/2013  (c) systemic tPA (100 mg IV x1) given 11/04/2013  (d) Rivaroxaban started 11/05/2013 (15 mg by mouth twice a day, started 20 mg/d 11/28/2013)  (2) hypercoagulable workup:  (a) lupus anticoagulant detected 11/02/2013, repeat pending  (b) protein C activity 66%, total 41%, 11/02/2013, repeat pending  (c) protein S activity 75%,  total 85%, repeat pending  (d) beta-2 glycoprotein IgA elevated at 183, IgM elevated at 45, 11/02/2013, repeat pending  (e) anti-cardiolipin IgM 20 at 11/10/2013, repeat pending (IgA and IgG normal)  (f) antithrombin III 72% 11/02/2013  (g) heterozygous for factor V 5 Leiden mutation  (h) prothrombin gene mutation negative  PLAN: -- doppler; if clot cleared:  -- filter removal (stop rivaroxaban 3 days before procedure, bridge with Lovenox) -- continue rivaroxaban at 20 mg/ day x 6 months -- likely continue lifelong anticoagulation (drop dose to 10 mg/ day after 6 months?)--  We spent the better part of today's hour-long visit discussing the pathophysiology of the factor V Leiden mutation. Joe Molina understands his factor V and protein C levels would be normal, but the docking portion for protein C on factor V is missing in his case. Protein C normally turns off factor V when it becomes activated. Accordingly with this mutation, protein C is unable to turn off factor V, and when coagulation is initiated it tends to continue unopposed.  He may well have inherited this from his mother, who apparently had an unprovoked DVT at some point in the past (she really cannot remember details). In any case Joe Molina has one normal and one abnormal factor V gene and therefore his children have a 50% chance of having inherited the factor V Leiden mutation. They should be tested so they can take appropriate precautions when undergoing surgery, when taking birth control, etc.  I believe given the severity of Joe Molina's pulmonary embolus--he could easily have died at onset a month ago--we are going to continue anticoagulation indefinitely. Of course there is a bleeding risk associated with this. Accordingly after 6 months we will consider possibly dropping the Rivaroxaban dose to 10 mg a day. I do not find data for this suggestion (the EINSTEIN extension study continued Rivaroxaban at full dose) but will discuss it with Dr. Isaiah Serge  at [facility] and get his input before making a final decision.  Since the half-life of Rivaroxaban is in the order of 10 hours or so, we will plan to stop the drug 3 days before his filter is removed. He will be bridged with Lovenox and we will start the Rivaroxaban the same evening assuming there has been no evidence of bleeding. Ripken will have his repeat Doppler study date and we will talk next week to set up the filter removal procedure with interventional radiology.  Xyan is also interested in having an upper endoscopy and colonoscopy. I have no problem with either procedure except that biopsies are not going to be possible while he is on Rivaroxaban. An alternative would be to have the upper endoscopy is planned, but a virtual colonoscopy, and then make specific plans for biopsies if anything shows on the capsule films.  I will see Lela again in 6-8 weeks primarily to make sure all his questions are answered. I will then see him again in 6 months. He has a good understanding of the overall plan, and agrees with it. He knows to call for any problems that may develop before his next visit here.  Lowella Dell, MD   11/28/2013 3:54 PM

## 2013-11-28 NOTE — Progress Notes (Signed)
Checked in new pt with no financial concerns. °

## 2013-11-29 ENCOUNTER — Telehealth: Payer: Self-pay | Admitting: Critical Care Medicine

## 2013-11-29 ENCOUNTER — Telehealth: Payer: Self-pay | Admitting: Internal Medicine

## 2013-11-29 DIAGNOSIS — I2699 Other pulmonary embolism without acute cor pulmonale: Secondary | ICD-10-CM

## 2013-11-29 DIAGNOSIS — I82409 Acute embolism and thrombosis of unspecified deep veins of unspecified lower extremity: Secondary | ICD-10-CM

## 2013-11-29 LAB — HYPERCOAGULABLE PANEL, COMPREHENSIVE
ANTICARDIOLIPIN IGG: 14 GPL U/mL (ref <23)
AntiThromb III Func: 94 % (ref 76–126)
Anticardiolipin IgA: 21 APL U/mL (ref <22)
Anticardiolipin IgM: 17 MPL U/mL — ABNORMAL HIGH (ref <11)
Beta-2 Glyco I IgG: 3 G Units (ref <20)
Beta-2-Glycoprotein I IgA: 159 A Units — ABNORMAL HIGH (ref <20)
Beta-2-Glycoprotein I IgM: 47 M Units — ABNORMAL HIGH (ref <20)
DRVVT 1:1 Mix: 42.8 secs (ref <42.9)
DRVVT: 52.1 secs — ABNORMAL HIGH (ref <42.9)
Lupus Anticoagulant: NOT DETECTED
PROTEIN C ACTIVITY: 125 % (ref 75–133)
PROTEIN S TOTAL: 116 % (ref 60–150)
PTT Lupus Anticoagulant: 45.2 secs — ABNORMAL HIGH (ref 28.0–43.0)
PTTLA 4:1 Mix: 43.5 secs — ABNORMAL HIGH (ref 28.0–43.0)
PTTLA CONFIRMATION: 2.3 s (ref <8.0)
Protein C, Total: 73 % (ref 72–160)
Protein S Activity: 141 % — ABNORMAL HIGH (ref 69–129)

## 2013-11-29 LAB — PSA: PSA: 1.74 ng/mL (ref <=4.00)

## 2013-11-29 LAB — CEA: CEA: 0.8 ng/mL (ref 0.0–5.0)

## 2013-11-29 LAB — CANCER ANTIGEN 19-9: CA 19-9: 14.5 U/mL (ref <35.0)

## 2013-11-29 MED ORDER — ENOXAPARIN SODIUM 100 MG/ML ~~LOC~~ SOLN: 90.0000 mg | Freq: Two times a day (BID) | SUBCUTANEOUS | Status: DC

## 2013-11-29 NOTE — Progress Notes (Signed)
Quick Note:  This has been handled. Please see phone msg from 11/29/13 for details. Pt aware. ______

## 2013-11-29 NOTE — Telephone Encounter (Signed)
Called, spoke with Anderson Malta with IR.  Informed her PW would like pt to be on the lovenox bridge - He does NOT want pt to cont with Xarelto up until procedure.  Pt can resume the xarelto one day AFTERprocedure per below.  She verbalized understanding of this and has put this note in.  States we will be responsible for the specifics of the lovenox.    Called [facility].  Spoke with Gerald Stabs.  Was advised they will do a full lovenox consultation with the pt prior to sending him home with medication.  This will include instructions on how to administer medication and how to remove extra medication from syringe to = 90 mg as it comes in 100 mg syringes..  I have given VO for lovenox to [facility] who verbalized understanding and has made note to provide pt to administration instructions.  Nothing further needed.  Called, spoke with pt.  I have informed him of specifics regarding filter appt date and time.  Also advised to stop the xarelto 3 days prior to procedure and start lovenox bridging bid x 3 days - first lovenox dose tomorrow with last dose on Monday 12 hours prior to procedure. Advised he can resume the xarelto 20 mg one day after procedure per below recs per PW.  He verbalized understanding of this, is aware lovenox rx was sent to [facility], and they will show him how to administer medication.  He is to call back with any questions or concerns.  Pt email sent to pt via mychart with above information as well per his request.   Will sign off and route to PW as FYI that this has been handled.

## 2013-11-29 NOTE — Telephone Encounter (Signed)
Reviewed his situation We plan to perform a screening EGD ([facility]?) and a surveillance colonoscopy in the summer.  Would not do CT colonoscopy as not appropriate in one with hx polyps - as long as we can bridge him for procedures and should be ok to do that  He will call back sooner prn

## 2013-11-29 NOTE — Telephone Encounter (Signed)
Result Notes    Notes Recorded by Elsie Stain, MD on 11/29/2013 at 8:53 AM This pt weighs 90KG so dose would be 90mg  sq lovenox BID for three days. Then one day after procedure resume xarelto 20mg  daily Note he needs to STOP lovenox 12hours before time of filter retrieval ------  Notes Recorded by Elsie Stain, MD on 11/29/2013 at 8:52 AM Pt aware of results. Pt needs filter retrieved next Tuesday or Wednesday. Pt needs to stop xarelto 3 days before procedure and start lovenox bridging sq. Dose would be 1mg /kg twice daily.   ---------  Filter removal is scheduled with Anderson Malta in IR for Tuesday, Jan 20.  Pt will need to arrive at [facility] at 7:30 am at [facility] and go to short stay.  He will need to be NPO after midnight except am meds with only enough water to get meds down.    PW DOES want to cont with the Lovenox bridge before procedure even though IR usually continues xarelto.  He feels this is the safest option for this pt.  Hematology has also been consulted regarding this.  I have lmomtcb for Anderson Malta with IR at [phone] to discuss this further.

## 2013-11-29 NOTE — Telephone Encounter (Signed)
Noted,  Excellent work Teacher, music

## 2013-12-02 ENCOUNTER — Telehealth: Payer: Self-pay | Admitting: Oncology

## 2013-12-02 ENCOUNTER — Other Ambulatory Visit (HOSPITAL_COMMUNITY): Payer: Self-pay | Admitting: Radiology

## 2013-12-02 ENCOUNTER — Other Ambulatory Visit (HOSPITAL_COMMUNITY): Payer: 59

## 2013-12-02 NOTE — Telephone Encounter (Signed)
lmonvm for pt re appt for 3/9 @ 4pm. schedule mailed.

## 2013-12-03 ENCOUNTER — Encounter (HOSPITAL_COMMUNITY): Payer: Self-pay

## 2013-12-03 ENCOUNTER — Other Ambulatory Visit: Payer: Self-pay | Admitting: Critical Care Medicine

## 2013-12-03 ENCOUNTER — Ambulatory Visit (HOSPITAL_COMMUNITY)
Admission: RE | Admit: 2013-12-03 | Discharge: 2013-12-03 | Disposition: A | Discharge status: Home / Self Care | Payer: 59 | Source: Ambulatory Visit | Attending: Critical Care Medicine | Admitting: Critical Care Medicine

## 2013-12-03 DIAGNOSIS — Z860100 Personal history of colon polyps, unspecified: Secondary | ICD-10-CM | POA: Insufficient documentation

## 2013-12-03 DIAGNOSIS — Z4689 Encounter for fitting and adjustment of other specified devices: Secondary | ICD-10-CM | POA: Insufficient documentation

## 2013-12-03 DIAGNOSIS — Z86711 Personal history of pulmonary embolism: Secondary | ICD-10-CM | POA: Insufficient documentation

## 2013-12-03 DIAGNOSIS — I825Y9 Chronic embolism and thrombosis of unspecified deep veins of unspecified proximal lower extremity: Secondary | ICD-10-CM | POA: Insufficient documentation

## 2013-12-03 DIAGNOSIS — Z7901 Long term (current) use of anticoagulants: Secondary | ICD-10-CM | POA: Insufficient documentation

## 2013-12-03 DIAGNOSIS — I82409 Acute embolism and thrombosis of unspecified deep veins of unspecified lower extremity: Secondary | ICD-10-CM

## 2013-12-03 DIAGNOSIS — K219 Gastro-esophageal reflux disease without esophagitis: Secondary | ICD-10-CM | POA: Insufficient documentation

## 2013-12-03 DIAGNOSIS — E785 Hyperlipidemia, unspecified: Secondary | ICD-10-CM | POA: Insufficient documentation

## 2013-12-03 DIAGNOSIS — I2699 Other pulmonary embolism without acute cor pulmonale: Secondary | ICD-10-CM

## 2013-12-03 DIAGNOSIS — Z86718 Personal history of other venous thrombosis and embolism: Secondary | ICD-10-CM | POA: Insufficient documentation

## 2013-12-03 DIAGNOSIS — D6859 Other primary thrombophilia: Secondary | ICD-10-CM | POA: Insufficient documentation

## 2013-12-03 DIAGNOSIS — I10 Essential (primary) hypertension: Secondary | ICD-10-CM | POA: Insufficient documentation

## 2013-12-03 LAB — BASIC METABOLIC PANEL
BUN: 10 mg/dL (ref 6–23)
CALCIUM: 9.1 mg/dL (ref 8.4–10.5)
CHLORIDE: 104 meq/L (ref 96–112)
CO2: 27 mEq/L (ref 19–32)
Creatinine, Ser: 0.81 mg/dL (ref 0.50–1.35)
GFR calc non Af Amer: >90 mL/min (ref >90)
Glucose, Bld: 98 mg/dL (ref 70–99)
Potassium: 4 mEq/L (ref 3.7–5.3)
Sodium: 143 mEq/L (ref 137–147)

## 2013-12-03 LAB — CBC WITH DIFFERENTIAL/PLATELET
Basophils Absolute: 0 10*3/uL (ref 0.0–0.1)
Basophils Relative: 1 % (ref 0–1)
Eosinophils Absolute: 0.1 10*3/uL (ref 0.0–0.7)
Eosinophils Relative: 3 % (ref 0–5)
HCT: 37.8 % — ABNORMAL LOW (ref 39.0–52.0)
Hemoglobin: 12.9 g/dL — ABNORMAL LOW (ref 13.0–17.0)
Lymphocytes Relative: 39 % (ref 12–46)
Lymphs Abs: 1.2 10*3/uL (ref 0.7–4.0)
MCH: 32 pg (ref 26.0–34.0)
MCHC: 34.1 g/dL (ref 30.0–36.0)
MCV: 93.8 fL (ref 78.0–100.0)
Monocytes Absolute: 0.3 10*3/uL (ref 0.1–1.0)
Monocytes Relative: 10 % (ref 3–12)
Neutro Abs: 1.6 10*3/uL — ABNORMAL LOW (ref 1.7–7.7)
Neutrophils Relative %: 48 % (ref 43–77)
Platelets: 211 10*3/uL (ref 150–400)
RBC: 4.03 MIL/uL — ABNORMAL LOW (ref 4.22–5.81)
RDW: 12.9 % (ref 11.5–15.5)
WBC: 3.2 10*3/uL — ABNORMAL LOW (ref 4.0–10.5)

## 2013-12-03 LAB — PROTIME-INR
INR: 1.01 (ref 0.00–1.49)
Prothrombin Time: 13.1 seconds (ref 11.6–15.2)

## 2013-12-03 LAB — APTT: aPTT: 33 seconds (ref 24–37)

## 2013-12-03 MED ORDER — RIVAROXABAN 20 MG PO TABS: 20.0000 mg | ORAL_TABLET | Freq: Every day | ORAL | Status: DC

## 2013-12-03 MED ORDER — IOHEXOL 300 MG/ML  SOLN
100.0000 mL | Freq: Once | INTRAMUSCULAR | Status: AC | PRN
  Administered 2013-12-03: 50 mL via INTRAVENOUS

## 2013-12-03 MED ORDER — MIDAZOLAM HCL 2 MG/2ML IJ SOLN
INTRAMUSCULAR | Status: AC
  Filled 2013-12-03: qty 4

## 2013-12-03 MED ORDER — MIDAZOLAM HCL 2 MG/2ML IJ SOLN
INTRAMUSCULAR | Status: AC | PRN
Start: 2013-12-03 — End: 2013-12-03
  Administered 2013-12-03: 0.5 mg via INTRAVENOUS
  Administered 2013-12-03: 1 mg via INTRAVENOUS

## 2013-12-03 MED ORDER — FENTANYL CITRATE 0.05 MG/ML IJ SOLN
INTRAMUSCULAR | Status: AC
  Filled 2013-12-03: qty 2

## 2013-12-03 MED ORDER — FENTANYL CITRATE 0.05 MG/ML IJ SOLN
INTRAMUSCULAR | Status: AC | PRN
  Administered 2013-12-03 (×3): 25 ug via INTRAVENOUS

## 2013-12-03 MED ORDER — SODIUM CHLORIDE 0.9 % IV SOLN
Freq: Once | INTRAVENOUS | Status: AC
Start: 2013-12-03 — End: 2013-12-03
  Administered 2013-12-03: 08:00:00 via INTRAVENOUS

## 2013-12-03 NOTE — Procedures (Signed)
Successful retrieval of infrarenal IVC filter.  No immediate complications.

## 2013-12-03 NOTE — Discharge Instructions (Signed)
CALL [phone] IF ANY PROBLEMS,QUESTIONS,OR CONCERNS; CALL IF ANY BLEEDING,DRAINAGE,FEVER,PAIN,SWELLING OR REDNESS AT SITE

## 2013-12-03 NOTE — H&P (Signed)
Joe Molina is an 58 y.o. male.   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 Admitted with chest pain; cough; sob. Was placed on Heparin and IVC filter was also placed 11/04/13 Started Xarelto po daily while in hospital--ongoing. Work up revealed + factor V Leiden mutation Lifetime coagulation probable per Dr Jana Hakim New doppler 11/28/13 shows no thrombus Scheduled now for removal Pt has been off Xarelto for 3 days--bridging on Lovenox; No Lovenox this am.  HPI: HTN; HLD; GERD; factor V Leiden mutation  Past Medical History  Diagnosis Date  . Hypertension   . Hyperlipidemia   . GERD (gastroesophageal reflux disease)   . Personal history of colonic adenomas 10/19/2010    Past Surgical History  Procedure Laterality Date  . Cholecystectomy  06//06    Dr. Deon Pilling  . Colonscopy  10-19-10    per Dr. Senaida Ores polyps, repeat in 3 years    Family History  Problem Relation Age of Onset  . Heart disease      male 1st degree relative at 59 yo   Social History:  reports that he has never smoked. He has never used smokeless tobacco. He reports that he drinks about 3.5 ounces of alcohol per week. He reports that he does not use illicit drugs.  Allergies:  Allergies  Allergen Reactions  . Codeine Sulfate     REACTION: nausea     (Not in a hospital admission)  Results for orders placed during the hospital encounter of 12/03/13 (from the past 48 hour(s))  CBC WITH DIFFERENTIAL     Status: Abnormal   Collection Time    12/03/13  7:56 AM      Result Value Range   WBC 3.2 (*) 4.0 - 10.5 K/uL   RBC 4.03 (*) 4.22 - 5.81 MIL/uL   Hemoglobin 12.9 (*) 13.0 - 17.0 g/dL   HCT 37.8 (*) 39.0 - 52.0 %   MCV 93.8  78.0 - 100.0 fL   MCH 32.0  26.0 - 34.0 pg   MCHC 34.1  30.0 - 36.0 g/dL   RDW 12.9  11.5 - 15.5 %   Platelets 211  150 - 400 K/uL   Neutrophils Relative % 48  43 - 77 %   Neutro Abs 1.6  (*) 1.7 - 7.7 K/uL   Lymphocytes Relative 39  12 - 46 %   Lymphs Abs 1.2  0.7 - 4.0 K/uL   Monocytes Relative 10  3 - 12 %   Monocytes Absolute 0.3  0.1 - 1.0 K/uL   Eosinophils Relative 3  0 - 5 %   Eosinophils Absolute 0.1  0.0 - 0.7 K/uL   Basophils Relative 1  0 - 1 %   Basophils Absolute 0.0  0.0 - 0.1 K/uL   No results found.  Review of Systems  Constitutional: Negative for fever and weight loss.  Respiratory: Negative for shortness of breath.   Cardiovascular: Negative for chest pain.  Gastrointestinal: Negative for vomiting and abdominal pain.  Neurological: Negative for dizziness, weakness and headaches.  Psychiatric/Behavioral: Negative for substance abuse.    Blood pressure 135/79, pulse 55, temperature 97.7 F (36.5 C), temperature source Oral, resp. rate 20, height 5\' 10"  (1.778 m), weight 185 lb (83.915 kg), SpO2 98.00%. Physical Exam  Constitutional: He is oriented to person, place, and time. He appears well-developed and well-nourished.  Cardiovascular: Normal rate, regular rhythm and normal heart sounds.   No murmur  heard. Respiratory: Effort normal and breath sounds normal. He has no wheezes.  GI: Soft. Bowel sounds are normal. There is no tenderness.  Musculoskeletal: Normal range of motion.  Neurological: He is alert and oriented to person, place, and time.  Skin: Skin is warm and dry.  Psychiatric: He has a normal mood and affect. His behavior is normal. Judgment and thought content normal.     Assessment/Plan Development of unprovoked LLE DVT 10/2013 New dx factor V Leiden mutation IVC filter placed 11/04/13 Has been on Xarelto since New doppler neg for LLE DVT Now scheduled for IVC filter removal Has been off Xarelto x 3 days--bridged with Lovenox- no Lovenox today Pt aware of procedure benefits and risks and agreeable to proceed Consent signed and in chart  Danijela Vessey A 12/03/2013, 8:35 AM

## 2013-12-04 ENCOUNTER — Encounter: Payer: Self-pay | Admitting: Oncology

## 2013-12-30 ENCOUNTER — Ambulatory Visit (INDEPENDENT_AMBULATORY_CARE_PROVIDER_SITE_OTHER): Payer: 59 | Admitting: Critical Care Medicine

## 2013-12-30 ENCOUNTER — Other Ambulatory Visit: Payer: Self-pay | Admitting: Family Medicine

## 2013-12-30 ENCOUNTER — Encounter: Payer: Self-pay | Admitting: Critical Care Medicine

## 2013-12-30 VITALS — BP 126/86 | HR 64 | Temp 97.5°F | Ht 70.0 in | Wt 192.2 lb

## 2013-12-30 DIAGNOSIS — I82409 Acute embolism and thrombosis of unspecified deep veins of unspecified lower extremity: Secondary | ICD-10-CM

## 2013-12-30 DIAGNOSIS — I2699 Other pulmonary embolism without acute cor pulmonale: Secondary | ICD-10-CM

## 2013-12-30 DIAGNOSIS — D6851 Activated protein C resistance: Secondary | ICD-10-CM

## 2013-12-30 DIAGNOSIS — D6859 Other primary thrombophilia: Secondary | ICD-10-CM

## 2013-12-30 NOTE — Patient Instructions (Signed)
No change in medications Return 6 months 

## 2013-12-30 NOTE — Assessment & Plan Note (Signed)
History of sub-acute massive pulmonary embolism with deep venous thrombosis and factor V Leiden mutation The patient's IVC filter has now been removed Plan Maintains xarelto for life would consider 20 mg daily xarelto for 12 months followed by reduction to 10 mg daily thereafter

## 2013-12-30 NOTE — Assessment & Plan Note (Signed)
Deep venous thrombosis with factor V Leiden abnormality Referred to pulmonary embolism assessment

## 2013-12-30 NOTE — Assessment & Plan Note (Signed)
Referred to pulmonary embolism assessment

## 2013-12-30 NOTE — Progress Notes (Signed)
Subjective:    Patient ID: Joe Molina, male    DOB: 10/16/1956, 58 y.o.   MRN: [id number]  HPI  11/18/2013 Chief Complaint  Patient presents with  . Hospitalization Follow-up    Pt following up from b/l clots in lungs.  Pt c/o occ. nonprod cough, no other complaints with his breathing.    This patient was admitted to [facility] on 11/01/2013 for presumed right lower lobe pneumonia. The patient had previously been given a course of azithromycin on 10/28/2013 for a 2 day history of sudden onset fever cough and dyspnea. The patient had associated hemoptysis. Subsequent to the admission the patient was worsening with hypoxemia on 11/02/2013 and found to have a submassive pulmonary embolism with right ventricular strain elevated BNP. The patient was also found to have a large left lower sternum a deep venous thrombosis up to the common femoral vein. This patient subsequently underwent on 11/04/2013 and inferior indicated the filter placement and systemic TPA. The patient started on a heparin drip per 24 hours prior to this procedure. The patient was found to have a large mobile clot in the left common femoral vein and this is the genesis for the inferior vena cava filter. The patient was subsequently transitioned over to oral xarelto.  The patient was subsequently discharged home on 11/06/2013. Since discharge the patient has increased ambulation. There is no chest pain. There is no further hemoptysis. There is minimal cough. The patient's dyspnea is markedly improved. There's been no excess bleeding. Note a hypercoagulable study was not obtained during this hospitalization. The patient has a scheduled colonoscopy and endoscopy upcoming. Note the patient has a history of deep venous thrombosis in his mother. No other positive family history for hypercoagulable state.  Pt denies any significant sore throat, nasal congestion or excess secretions, fever, chills, sweats, unintended weight loss,  pleurtic or exertional chest pain, orthopnea PND, or leg swelling Pt denies any increase in rescue therapy over baseline, denies waking up needing it or having any early am or nocturnal exacerbations of coughing/wheezing/or dyspnea. Pt also denies any obvious fluctuation in symptoms with  weather or environmental change or other alleviating or aggravating factors  12/30/2013 Chief Complaint  Patient presents with  . 6 wk follow up    Breathing doing well overall.  No SOB, chest tightness/pain, or cough.    Doing well.  No dyspnea.  No difficulty. No bleeding.   Down 12#.  Cough now gone. No hemoptysis. Filter is out. Pt saw Magrinat.  Factor V leiden mutation.    Past Medical History  Diagnosis Date  . Hypertension   . Hyperlipidemia   . GERD (gastroesophageal reflux disease)   . Personal history of colonic adenomas 10/19/2010     Family History  Problem Relation Age of Onset  . Heart disease      male 1st degree relative at 59 yo     History   Social History  . Marital Status: Married    Spouse Name: N/A    Number of Children: N/A  . Years of Education: N/A   Occupational History  . Not on file.   Social History Main Topics  . Smoking status: Never Smoker   . Smokeless tobacco: Never Used  . Alcohol Use: 3.5 oz/week    7 drink(s) per week     Comment: 5 drinks per week - 11/01/13  . Drug Use: No  . Sexual Activity: Not on file   Other Topics Concern  .  Not on file   Social History Narrative   Occupation: Engineer, materials for [employer]   Married     Allergies  Allergen Reactions  . Codeine Sulfate     REACTION: nausea     Outpatient Prescriptions Prior to Visit  Medication Sig Dispense Refill  . lisinopril (PRINIVIL,ZESTRIL) 10 MG tablet Take 1 tablet (10 mg total) by mouth daily.  90 tablet  3  . Multiple Vitamin (MULTIVITAMIN) tablet Take 1 tablet by mouth daily. 50 plus      . Omega-3 Fatty Acids (FISH OIL PO) Take 1  capsule by mouth daily.       . Rivaroxaban (XARELTO) 20 MG TABS tablet Take 1 tablet (20 mg total) by mouth daily with supper.  30 tablet  6  . pantoprazole (PROTONIX) 40 MG tablet Take 1 tablet (40 mg total) by mouth daily.  90 tablet  3  . HYDROcodone-homatropine (HYDROMET) 5-1.5 MG/5ML syrup Take 5 mLs by mouth every 4 (four) hours as needed for cough.  240 mL  0  . Red Yeast Rice 600 MG CAPS Take 600 mg by mouth daily.        No facility-administered medications prior to visit.     Review of Systems  Constitutional:   No  weight loss, night sweats,  Fevers, chills, fatigue, lassitude. HEENT:   No headaches,  Difficulty swallowing,  Tooth/dental problems,  Sore throat,                No sneezing, itching, ear ache, nasal congestion, post nasal drip,   CV:  No chest pain,  Orthopnea, PND, swelling in lower extremities, anasarca, dizziness, palpitations  GI  No heartburn, indigestion, abdominal pain, nausea, vomiting, diarrhea, change in bowel habits, loss of appetite  Resp: No shortness of breath with exertion or at rest.  No excess mucus, no productive cough, ++ non-productive cough,  No coughing up of blood.  No change in color of mucus.  No wheezing.  No chest wall deformity  Skin: no rash or lesions.  GU: no dysuria, change in color of urine, no urgency or frequency.  No flank pain.  MS:  No joint pain or swelling.  No decreased range of motion.  No back pain.  Psych:  No change in mood or affect. No depression or anxiety.  No memory loss.      Objective:   Physical Exam  Filed Vitals:   12/30/13 0900  BP: 126/86  Pulse: 64  Temp: 97.5 F (36.4 C)  TempSrc: Oral  Height: 5\' 10"  (1.778 m)  Weight: 192 lb 3.2 oz (87.181 kg)  SpO2: 100%    Gen: Pleasant, well-nourished, in no distress,  normal affect  ENT: No lesions,  mouth clear,  oropharynx clear, no postnasal drip  Neck: No JVD, no TMG, no carotid bruits  Lungs: No use of accessory muscles, no dullness to  percussion, clear without rales or rhonchi  Cardiovascular: RRR, heart sounds normal, no murmur or gallops, no edema  Abdomen: soft and NT, no HSM,  BS normal  Musculoskeletal: No deformities, no cyanosis or clubbing  Neuro: alert, non focal  Skin: Warm, no lesions or rashes  No results found.    Assessment & Plan:   Subacute massive pulmonary embolism History of sub-acute massive pulmonary embolism with deep venous thrombosis and factor V Leiden mutation The patient's IVC filter has now been removed Plan Maintains xarelto for life would consider 20 mg daily xarelto for  12 months followed by reduction to 10 mg daily thereafter   DVT (deep venous thrombosis) Deep venous thrombosis with factor V Leiden abnormality Referred to pulmonary embolism assessment  Factor V Leiden mutation Referred to pulmonary embolism assessment    Updated Medication List Outpatient Encounter Prescriptions as of 12/30/2013  Medication Sig  . lisinopril (PRINIVIL,ZESTRIL) 10 MG tablet Take 1 tablet (10 mg total) by mouth daily.  . Multiple Vitamin (MULTIVITAMIN) tablet Take 1 tablet by mouth daily. 50 plus  . Omega-3 Fatty Acids (FISH OIL PO) Take 1 capsule by mouth daily.   . Rivaroxaban (XARELTO) 20 MG TABS tablet Take 1 tablet (20 mg total) by mouth daily with supper.  . [DISCONTINUED] pantoprazole (PROTONIX) 40 MG tablet Take 1 tablet (40 mg total) by mouth daily.  . [DISCONTINUED] HYDROcodone-homatropine (HYDROMET) 5-1.5 MG/5ML syrup Take 5 mLs by mouth every 4 (four) hours as needed for cough.  . [DISCONTINUED] Red Yeast Rice 600 MG CAPS Take 600 mg by mouth daily.

## 2014-01-13 ENCOUNTER — Other Ambulatory Visit: Payer: Self-pay | Admitting: Family Medicine

## 2014-01-20 ENCOUNTER — Ambulatory Visit (HOSPITAL_BASED_OUTPATIENT_CLINIC_OR_DEPARTMENT_OTHER): Payer: 59 | Admitting: Oncology

## 2014-01-20 VITALS — BP 119/74 | HR 57 | Temp 98.3°F | Resp 20 | Ht 70.0 in | Wt 191.4 lb

## 2014-01-20 DIAGNOSIS — I2699 Other pulmonary embolism without acute cor pulmonale: Secondary | ICD-10-CM

## 2014-01-20 DIAGNOSIS — D6859 Other primary thrombophilia: Secondary | ICD-10-CM

## 2014-01-20 DIAGNOSIS — Z7901 Long term (current) use of anticoagulants: Secondary | ICD-10-CM

## 2014-01-20 DIAGNOSIS — I82409 Acute embolism and thrombosis of unspecified deep veins of unspecified lower extremity: Secondary | ICD-10-CM

## 2014-01-20 DIAGNOSIS — D6851 Activated protein C resistance: Secondary | ICD-10-CM

## 2014-01-20 DIAGNOSIS — D689 Coagulation defect, unspecified: Secondary | ICD-10-CM

## 2014-01-20 NOTE — Progress Notes (Signed)
[facility]  Telephone:([phone]) [phone] Fax:(336) [phone]     ID: MING YOUNGERS OB: [birth year]  MR#: [id number]  [id number]  PCP: Laurey Morale, [name], Silvano Rusk. Murali Ramaswami  CHIEF COMPLAINT: Factor V Leiden mutation 2 months out from a massive pulmonary embolus  HISTORY OF COAGULOPATHY: Darrik did quite a bit of driving around Thanksgiving's to visit family. He tells me he was careful to stop and take walks every couple of hours. Nevertheless he developed some calf pain in December, which he paid little attention to. On 10/28/2013 he presented to his primary care physician, Dr. Sarajane Jews, complaining of what seemed like flulike symptoms. He was started on a Z-Pak. On 11/01/2013 however he felt considerably worse and presented to the emergency room with fever, cough, shortness of breath, and pleuritic chest pain. He was found to have a leukocytosis and a right lower lobe infiltrate on the chest x-ray. A preliminary diagnosis of community acquired pneumonia was made and he was admitted.  However, on the next day, 11/02/2013, he was feeling no better and Dr. Chase Caller obtained a CT angiogram which showed a large central pulmonary embolus with bilateral extension, with evidence of right heart strain (the RV: LL ratio was 1.8, with normal being less than 0.9.) Doppler ultrasound the same day documented an acute DVT in the left lower extremity. The patient was started on intravenous heparin 11/02/2013. However, within 48 hours his condition had not significantly improved and therefore, after appropriate discussion, he had an IVC filter placed and received systemic tPA with Alteplase at 100 mg IV.  Hosam reports that within a few hours most of his symptoms had largely resolved, and he could breathe much better. Rivaroxaban/ Xarelto was started on December 23 at the usual starting dose of 15 mg twice a day. He is tolerating it well, with no evidence of bleeding  problems and no recurrence of his symptoms. He started the 20 mg daily dose 11/28/2013.  Workup during his admission showed a possible lupus anticoagulant (that test is being repeated) more importantly, there was a factor V Leiden mutation present.  His subsequent history is as detailed below  INTERVAL HISTORY: Zyen returns for followup of his coagulopathy. He is tolerating the Rivaroxaban with no symptoms that he is aware of, and specifically no bleeding, not even mild epistaxis or bright red blood per to him. He also denies bruising. He is able to obtain the drug Advair little cost of present  REVIEW OF SYSTEMS: He is using the  LEAP nutrition system and has lost 22 pounds on our scales between December and now. He is also exercising 4-5 days a week. Aside from some ringing in his ears, a detailed review of systems today was entirely negative.  PAST MEDICAL HISTORY: Past Medical History  Diagnosis Date  . Hypertension   . Hyperlipidemia   . GERD (gastroesophageal reflux disease)   . Personal history of colonic adenomas 10/19/2010    PAST SURGICAL HISTORY: Past Surgical History  Procedure Laterality Date  . Cholecystectomy  06//06    Dr. Deon Pilling  . Colonscopy  10-19-10    per Dr. Senaida Ores polyps, repeat in 3 years    FAMILY HISTORY Family History  Problem Relation Age of Onset  . Heart disease      male 1st degree relative at 61 yo  His father is 49, with a history of colitis. His mother is 39, and has a history of myocardial infarction in her  mid 22s. Apparently she recalls a lower extremity DVT, but no one else in the family remembers that N. she cannot provide any further details. Reyes has one brother and one sister. 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 He is married, with 2 children: Daughter Jarrett Soho is graduating from college with a degree in theater, and son Edison Nasuti is a  sophomore at [employer]: in place   HEALTH MAINTENANCE: History  Substance Use Topics  . Smoking status: Never Smoker   . Smokeless tobacco: Never Used  . Alcohol Use: 3.5 oz/week    7 drink(s) per week     Comment: 5 drinks per week - 11/01/13     Colonoscopy: pending  PSA: 1.74  Bone density:  Lipid panel:  Allergies  Allergen Reactions  . Codeine Sulfate     REACTION: nausea    Current Outpatient Prescriptions  Medication Sig Dispense Refill  . lisinopril (PRINIVIL,ZESTRIL) 10 MG tablet TAKE 1 TABLET BY MOUTH DAILY.  90 tablet  0  . Multiple Vitamin (MULTIVITAMIN) tablet Take 1 tablet by mouth daily. 50 plus      . Omega-3 Fatty Acids (FISH OIL PO) Take 1 capsule by mouth daily.       . pantoprazole (PROTONIX) 40 MG tablet TAKE 1 TABLET BY MOUTH DAILY.  90 tablet  0  . Rivaroxaban (XARELTO) 20 MG TABS tablet Take 1 tablet (20 mg total) by mouth daily with supper.  30 tablet  6   No current facility-administered medications for this visit.    OBJECTIVE: Middle-aged white male who appears well Filed Vitals:   01/20/14 1603  BP: 119/74  Pulse: 57  Temp: 98.3 F (36.8 C)  Resp: 20     Body mass index is 27.46 kg/(m^2).    ECOG FS:0 - Asymptomatic  Ocular: Sclerae unicteric, pupils round and equal Lymphatic: No cervical or supraclavicular adenopathy Lungs no rales or rhonchi, no dullness, good excursion bilaterally Heart regular rate and rhythm, no murmur appreciated Abd soft, nontender, positive bowel sounds MSK no obvious lymphedema Neuro: non-focal, well-oriented, appropriate affect   LAB RESULTS: 11/02/13 HETEROZYGOUS FOR FACTOR V MUTATION  Reference Interval: Negative for Factor V Mutation Interpretation: Factor V Leiden Gene Mutation (G1691A) is the most common genetic risk factor for thrombosis and accounts for 94% of individuals classified as Activated Protein C (APC) resistant. Testing for  Factor V Leiden Mutation is recommended for all individuals with venous thrombosis or history of a thrombic event. Both heterozygotes and homozygotes are at risk for thrombosis, which is 5-10 fold, and 50-100 fold respectively. DNA testing for the V R2 polymorphism is recommended for Factor V Leiden heterozygotes. Presence of this polymorphism further increases the risk of venous thrombosis in Factor V Leiden heterozygotes. DNA from this patient was tested using a FDA approved assay for Factor V Leiden. Performed at [facility]  Reference Interval: Negative for Prothrombin II Gene Mutation Interpretation: Prothrombin II 20210A Gene Mutation is a genetic risk factor resulting in an increase risk for venous thrombosis (five-fold); Myocardial infarction (two-fold); Cerebrovascular ischemic disease in young patients (especially females using oral contraceptives) and pulmonary embolism. Both heterozygous and homozygous carriers are at increased risk although homozygosity is rare. The incidence of heterozygous carriers of this mutation is estimated to be 1 to 2.5%  for individuals of European and African descent. DNA from this patient was tested using a FDA approved assay for Factor II Prothrombin Mutation. Performed at [facility] for KENET, VISCARDI (MRN [id number]) as of 11/28/2013 15:54  Ref. Range 11/25/2013 09:57  CA 19-9 Latest Range: <35.0 U/mL 14.5  CEA Latest Range: 0.0-5.0 ng/mL 0.8  PSA Latest Range: <=4.00 ng/mL 1.74  Results for MARTINE, SZALAY (MRN [id number]) as of 11/28/2013 15:54   Ref. Range 11/02/2013 10:40  Anticardiolipin IgA Latest Range: <22 APL U/mL 16  Anticardiolipin IgG Latest Range: <23 GPL U/mL 2 (L)  Anticardiolipin IgM Latest Range: <11 MPL U/mL 20 (H)  PTT Lupus Anticoagulant Latest Range: 28.0-43.0 secs 47.9 (H)  PTTLA Confirmation Latest Range: <8.0 secs 15.5 (H)  PTTLA 4:1 Mix Latest Range: 28.0-43.0 secs 46.4  (H)  DRVVT Latest Range: <42.9 secs 42.6  Drvvt confirmation Latest Range: <1.11 Ratio NOT APPL  dRVVT Incubated 1:1 Mix Latest Range: <42.9 secs NOT APPL  Lupus Anticoagulant Latest Range: NOT DETECTED  DETECTED (A)  Results for KAYVIN, MOODY (MRN [id number]) as of 11/28/2013 15:54  Ref. Range 11/02/2013 10:40  AntiThromb III Func Latest Range: 75-120 % 72 (L)    CMP     Component Value Date/Time   NA 143 12/03/2013 0756   K 4.0 12/03/2013 0756   CL 104 12/03/2013 0756   CO2 27 12/03/2013 0756   GLUCOSE 98 12/03/2013 0756   BUN 10 12/03/2013 0756   CREATININE 0.81 12/03/2013 0756   CALCIUM 9.1 12/03/2013 0756   PROT 6.0 11/02/2013 0841   ALBUMIN 2.7* 11/02/2013 0841   AST 31 11/02/2013 0841   ALT 39 11/02/2013 0841   ALKPHOS 52 11/02/2013 0841   BILITOT 1.8* 11/02/2013 0841   GFRNONAA >90 12/03/2013 0756   GFRAA >90 12/03/2013 0756    I No results found for this basename: SPEP,  UPEP,   kappa and lambda light chains    Lab Results  Component Value Date   WBC 3.2* 12/03/2013   NEUTROABS 1.6* 12/03/2013   HGB 12.9* 12/03/2013   HCT 37.8* 12/03/2013   MCV 93.8 12/03/2013   PLT 211 12/03/2013      Chemistry      Component Value Date/Time   NA 143 12/03/2013 0756   K 4.0 12/03/2013 0756   CL 104 12/03/2013 0756   CO2 27 12/03/2013 0756   BUN 10 12/03/2013 0756   CREATININE 0.81 12/03/2013 0756      Component Value Date/Time   CALCIUM 9.1 12/03/2013 0756   ALKPHOS 52 11/02/2013 0841   AST 31 11/02/2013 0841   ALT 39 11/02/2013 0841   BILITOT 1.8* 11/02/2013 0841       No results found for this basename: LABCA2    No components found with this basename: LABCA125    No results found for this basename: INR,  in the last 168 hours  Urinalysis    Component Value Date/Time   COLORURINE AMBER* 11/01/2013 0806   APPEARANCEUR CLEAR 11/01/2013 0806   LABSPEC 1.025 11/01/2013 0806   PHURINE 6.0 11/01/2013 0806   GLUCOSEU NEGATIVE 11/01/2013 0806   HGBUR NEGATIVE 11/01/2013 0806    HGBUR negative 08/04/2008 0856   BILIRUBINUR SMALL* 11/01/2013 0806   BILIRUBINUR n 11/15/2012 0912   KETONESUR 40* 11/01/2013 0806   PROTEINUR 100* 11/01/2013 0806   UROBILINOGEN 1.0 11/01/2013 0806   UROBILINOGEN 0.2 11/15/2012 0912   NITRITE NEGATIVE 11/01/2013 0806   NITRITE n 11/15/2012 [id number]  LEUKOCYTESUR TRACE* 11/01/2013 0806    STUDIES:  Ct Angio Chest Pe W/cm &/or Wo Cm  11/02/2013   CLINICAL DATA:  Increasing shortness of breast.  Fever.  EXAM: CT ANGIOGRAPHY CHEST WITH CONTRAST  TECHNIQUE: Multidetector CT imaging of the chest was performed using the standard protocol during bolus administration of intravenous contrast. Multiplanar CT image reconstructions including MIPs were obtained to evaluate the vascular anatomy.  CONTRAST:  OMNIPAQUE IOHEXOL 350 MG/ML SOLN  COMPARISON:  CHEST x-ray 11/02/2013  FINDINGS: There are large central pulmonary emboli with extension into both upper and lower lobes and likely into the right middle lobe as well. The largest clot burden is in the lower lobes bilaterally, right greater than left. Associated airspace disease within the right lower lobe with small right pleural effusion. This could be related to early pulmonary infarct/hemorrhage. There is evidence of right heart strain with elevated RV:LV ratio of 1.8 (normal is less than 0.9). Heart is mildly enlarged. Aorta is normal caliber. No mediastinal, hilar, or axillary adenopathy. Chest wall soft tissues are unremarkable. Imaging into the upper abdomen shows no acute findings.  No acute bony abnormality. Sclerotic focus within a mid thoracic vertebral body likely reflects a bone island.  Review of the MIP images confirms the above findings.  IMPRESSION: Positive for acute PE with CT evidence of right heart strain (RV/LV Ratio = 1.8) consistent with at least submassive (intermediate risk)PE. The presence of right heart strain has been associated with an increased risk of morbidity and mortality.  Consultation with Pulmonary and Critical Care Medicine is recommended.  Airspace disease within the right lower lung could reflect pulmonary infarcts/hemorrhage. Small right pleural effusion.  Critical Value/emergent results were called by telephone at the time of interpretation on 11/02/2013 at 9:35 AM to Dr. Kalman Shan , who verbally acknowledged these results.   Electronically Signed   By: Charlett Nose M.D.   On: 11/02/2013 09:35    ASSESSMENT: 58 y.o. with a history of submassive pulmonary embolus with heart strain diagnosed 11/02/2013 and left lower extremity DVT diagnosed the same day, found to be heterozygous for the factor V Leiden mutation  (1) acute treatment consisted of:  (a) intravenous heparin started 11/02/2013, discontinued 11/05/2013  (b) IVC filter placed 11/04/2013, retrieved 12/03/2013  (c) systemic tPA (100 mg IV x1) given 11/04/2013  (d) Rivaroxaban started 11/05/2013 (15 mg by mouth twice a day, started 20 mg/d 11/28/2013)  (e) lower extremity venous Doppler 11/28/2013 showed the right lower extremity to be clear. On the left the common femoral vein thrombus had resolved. There was a bifid femoral vein and both branches were compressible, without intraluminal thrombus. The profunda femoral vein was dilated and noncompressible just distal to the origin. The left popliteal vein was dilated with no flow identified. There was echogenic intramural thrombus There was no flow identified in the popliteal vein or gastrocnemius veins.  (2) hypercoagulable workup:  (a) lupus anticoagulant detected 11/02/2013, repeat pending  (b) protein C activity 66%, total 41%, 11/02/2013, repeat pending  (c) protein S activity 75%, total 85%, repeat pending  (d) beta-2 glycoprotein IgA elevated at 183, IgM elevated at 45, 11/02/2013, repeat pending  (e) anti-cardiolipin IgM 20 at 11/10/2013, repeat pending (IgA and IgG normal)  (f) antithrombin III 72% 11/02/2013  (g) heterozygous for factor V  5 Leiden mutation  (h) prothrombin gene mutation negative  PLAN: Kaleel has reacted to his recent medical history in the best possible way, by improving his diet and increasing his  exercise program. He has lost 22 pounds on our scales between December and now and certainly looks healthier. He is tolerating the rivaroxaban/ Xarelto without any side effects so for and specifically not even minor bleeding or bruising. He is also obtaining a very favorable cost.  He is in the process of testing his children. His son will be tested later this week. We discussed whether his daughter in [place] should be tested. She has been on oral contraceptives for some time so far without a clot. I urged him to get this done since there are many alternatives to oral contraceptives and if she is positive she would certainly want to change and not take the risk.  Brahm has expressed some interest in more "natural" products which also may have an anticoagulant effect. We discussed the fact that when truly natural products work it is frequently because they have an ingredient we have otherwise identified (as for instance Ambler wort and monoamine oxidase inhibitors). He is aware of recent reports showing that many of the ingredients claimed for some of these products are not what is actually included, that many can be contaminated with heavy metals especially if the ultimate source is [place], and that this [employer] has successfully [employer] to make sure they remain unregulated. Having said all that I certainly know very little about alternative "natural" anticoagulants. At least for now, given how well he is tolerating the rivaroxaban, I see little need for change.  The big question of course is what to do after 6 months. My own feeling is that given the fact that he has a permanent procoagulant genetic condition and that his initial pulmonary embolus was life-threatening, he should be on lifelong  anticoagulation. The real question is what is the best drug to use long-term and at what dose. We will like to balance the risk of bleeding with the need for long-term anticoagulation.  I think he would benefit from a discussion of this at [facility]. I am going to refer him to Dr. Odis Hollingshead there. I will see Quintavius back around his 6 month anniversary and we should be able to make a definitive decision then regarding how to proceed.   Chauncey Cruel, MD   01/20/2014 4:50 PM

## 2014-01-21 ENCOUNTER — Telehealth: Payer: Self-pay | Admitting: Oncology

## 2014-01-21 ENCOUNTER — Telehealth: Payer: Self-pay | Admitting: *Deleted

## 2014-01-21 NOTE — Telephone Encounter (Signed)
Faxed pt medical records to [facility] at Dr. Lorita Officer office at [facility].  After Dr. Joan Flores reviews the  records, Jeneen Rinks will call with appt. Pt is aware by vm.

## 2014-01-21 NOTE — Telephone Encounter (Signed)
sw pt gv appts for droppler on 04/22/14 @ 10am here at [facility]. i also gv appt for 04/24/14@ 4:30pm. Pt is aware that I gv order to [facility] to get the pt scheduled for Dr. Gareth Morgan and someone will be calling him w/ that appt...td

## 2014-01-30 ENCOUNTER — Telehealth: Payer: Self-pay | Admitting: Oncology

## 2014-01-30 NOTE — Telephone Encounter (Signed)
Dr. Lorita Officer np scheduler Jeneen Rinks) has called pt several times to schedule appt.  Pt states he will call Jeneen Rinks once he can clear his schedule.

## 2014-01-31 ENCOUNTER — Telehealth: Payer: Self-pay | Admitting: Hematology and Oncology

## 2014-01-31 NOTE — Telephone Encounter (Signed)
per email from [facility] transferred pt's care from [place] [facility] to [facility]. pt scheduled w/[facility] for 6/11 @ 9:30am. per Skip he will inform pt. scheduled mailed today.

## 2014-02-10 ENCOUNTER — Other Ambulatory Visit: Payer: Self-pay | Admitting: Oncology

## 2014-02-11 ENCOUNTER — Telehealth: Payer: Self-pay | Admitting: Oncology

## 2014-02-11 NOTE — Telephone Encounter (Signed)
Pt's appt. To see Dr. [email or web address]@[facility]  is 04/10/14@10 :00. Pt is aware

## 2014-02-15 ENCOUNTER — Other Ambulatory Visit: Payer: Self-pay | Admitting: Oncology

## 2014-04-02 ENCOUNTER — Other Ambulatory Visit: Payer: Self-pay | Admitting: Family Medicine

## 2014-04-14 ENCOUNTER — Encounter: Payer: Self-pay | Admitting: Internal Medicine

## 2014-04-18 ENCOUNTER — Other Ambulatory Visit: Payer: Self-pay | Admitting: Family Medicine

## 2014-04-20 ENCOUNTER — Other Ambulatory Visit: Payer: Self-pay | Admitting: Hematology and Oncology

## 2014-04-22 ENCOUNTER — Encounter (HOSPITAL_COMMUNITY): Payer: 59

## 2014-04-24 ENCOUNTER — Ambulatory Visit (HOSPITAL_BASED_OUTPATIENT_CLINIC_OR_DEPARTMENT_OTHER): Payer: 59 | Admitting: Hematology and Oncology

## 2014-04-24 ENCOUNTER — Encounter: Payer: Self-pay | Admitting: Hematology and Oncology

## 2014-04-24 VITALS — BP 129/75 | HR 61 | Temp 98.0°F | Resp 20 | Ht 70.0 in | Wt 188.5 lb

## 2014-04-24 DIAGNOSIS — D6851 Activated protein C resistance: Secondary | ICD-10-CM

## 2014-04-24 DIAGNOSIS — D649 Anemia, unspecified: Secondary | ICD-10-CM

## 2014-04-24 DIAGNOSIS — D638 Anemia in other chronic diseases classified elsewhere: Secondary | ICD-10-CM

## 2014-04-24 DIAGNOSIS — I82409 Acute embolism and thrombosis of unspecified deep veins of unspecified lower extremity: Secondary | ICD-10-CM

## 2014-04-24 DIAGNOSIS — D6859 Other primary thrombophilia: Secondary | ICD-10-CM

## 2014-04-24 DIAGNOSIS — D6862 Lupus anticoagulant syndrome: Secondary | ICD-10-CM | POA: Insufficient documentation

## 2014-04-24 DIAGNOSIS — I2699 Other pulmonary embolism without acute cor pulmonale: Secondary | ICD-10-CM

## 2014-04-24 NOTE — Progress Notes (Signed)
[facility] NOTE  Patient Care Team: Laurey Morale, MD as PCP - General  CHIEF COMPLAINTS/PURPOSE OF CONSULTATION:  Second opinion for recent DVT and PE  HISTORY OF PRESENTING ILLNESS:  Joe Molina 58 y.o. male is here because of transfer of care from another physician to my service. I am offering him as a second opinion today. I reviewed his records extensively. Izaha did quite a bit of driving around Thanksgiving to visit family.  He recalled 5 trips to [place] which averaged approximately 5 hours each and a long trip to [place] which was about 7 hours.  Prior to that, he also have an accidental injury whereby he hit his left ankle causing associated tenderness on his calf and swelling at the ankle, right around his long trip to [place]. He was careful and recalled making frequent stops. Nevertheless he developed some calf pain in December, which he paid little attention to. On 10/28/2013 he presented to his primary care physician, Dr. Sarajane Jews, complaining of what seemed like flulike symptoms. He was started on a Z-Pak. On 11/01/2013 however he felt considerably worse and presented to the emergency room with fever, cough, shortness of breath, and pleuritic chest pain. He was found to have a leukocytosis and a right lower lobe infiltrate on the chest x-ray. A preliminary diagnosis of community acquired pneumonia was made and he was admitted.  However, on the next day, 11/02/2013, he was feeling no better and Dr. Chase Caller obtained a CT angiogram which showed a large central pulmonary embolus with bilateral extension, with evidence of right heart strain (the RV: LL ratio was 1.8, with normal being less than 0.9.) Doppler ultrasound the same day documented an acute DVT in the left lower extremity. The patient was started on intravenous heparin 11/02/2013. However, within 48 hours his condition had not significantly improved and therefore, after appropriate discussion, he  had an IVC filter placed and received systemic tPA with Alteplase at 100 mg IV.  Joe Molina reports that within a few hours most of his symptoms had largely resolved, and he could breathe much better. Rivaroxaban/ Xarelto was started on December 23 at the usual starting dose of 15 mg twice a day. He is tolerating it well, with no evidence of bleeding problems and no recurrence of his symptoms. He started the 20 mg daily dose 11/28/2013.  Workup during his admission showed presence of lupus anticoagulant and heterozygous factor V Leiden mutation present. Other thrombophilia workup also showed negative prothrombin gene mutation study, presence of high titer of beta-2 glycoprotein antibody and mildly elevated anticardiolipin antibodies. 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 He had no prior history or diagnosis of cancer. His age appropriate screening programs are up-to-date. He had prior surgeries before and never had perioperative thromboembolic events. He does not take testosterone supplements. Currently, he feels well. The patient denies any recent signs or symptoms of bleeding such as spontaneous epistaxis, hematuria or hematochezia. He denies lower extremity swelling, warmth, tenderness & erythema.  He denies recent chest pain on exertion, shortness of breath on minimal exertion, pre-syncopal episodes, hemoptysis, or palpitation. He states that his mother had history of blood clots. There is no family history of miscarriages.  MEDICAL HISTORY:  Past Medical History  Diagnosis Date  . Hypertension   . Hyperlipidemia   . GERD (gastroesophageal reflux disease)   . Personal history of colonic adenomas 10/19/2010  SURGICAL HISTORY: Past Surgical History  Procedure Laterality Date  . Cholecystectomy  06//06    Dr. Deon Pilling  . Colonscopy  10-19-10    per Dr. Senaida Ores polyps,  repeat in 3 years  . Tonsillectomy      SOCIAL HISTORY: History   Social History  . Marital Status: Married    Spouse Name: N/A    Number of Children: N/A  . Years of Education: N/A   Occupational History  . Not on file.   Social History Main Topics  . Smoking status: Never Smoker   . Smokeless tobacco: Never Used  . Alcohol Use: 3.5 oz/week    7 drink(s) per week     Comment: 5 drinks per week - 11/01/13  . Drug Use: No  . Sexual Activity: Not on file   Other Topics Concern  . Not on file   Social History Narrative   Occupation: Engineer, materials for [employer]   Married    FAMILY HISTORY: Family History  Problem Relation Age of Onset  . Heart disease      male 1st degree relative at 59 yo  . Clotting disorder Mother     leg DVT  . Heart disease Mother     ALLERGIES:  is allergic to codeine sulfate.  MEDICATIONS:  Current Outpatient Prescriptions  Medication Sig Dispense Refill  . lisinopril (PRINIVIL,ZESTRIL) 10 MG tablet TAKE 1 TABLET BY MOUTH DAILY.  90 tablet  1  . Multiple Vitamin (MULTIVITAMIN) tablet Take 1 tablet by mouth daily. 50 plus      . Omega-3 Fatty Acids (FISH OIL PO) Take 1 capsule by mouth daily.       . pantoprazole (PROTONIX) 40 MG tablet TAKE 1 TABLET BY MOUTH DAILY.  90 tablet  1  . Rivaroxaban (XARELTO) 20 MG TABS tablet Take 1 tablet (20 mg total) by mouth daily with supper.  30 tablet  6   No current facility-administered medications for this visit.    REVIEW OF SYSTEMS:   Constitutional: Denies fevers, chills or abnormal night sweats Eyes: Denies blurriness of vision, double vision or watery eyes Ears, nose, mouth, throat, and face: Denies mucositis or sore throat Respiratory: Denies cough, dyspnea or wheezes Cardiovascular: Denies palpitation, chest discomfort or lower extremity swelling Gastrointestinal:  Denies nausea, heartburn or change in bowel habits Skin: Denies abnormal skin  rashes Lymphatics: Denies new lymphadenopathy or easy bruising Neurological:Denies numbness, tingling or new weaknesses Behavioral/Psych: Mood is stable, no new changes  All other systems were reviewed with the patient and are negative.  PHYSICAL EXAMINATION: ECOG PERFORMANCE STATUS: 0 - Asymptomatic  Filed Vitals:   04/24/14 0916  BP: 129/75  Pulse: 61  Temp: 98 F (36.7 C)  Resp: 20   Filed Weights   04/24/14 0916  Weight: 188 lb 8 oz (85.503 kg)    GENERAL:alert, no distress and comfortable SKIN: skin color, texture, turgor are normal, no rashes or significant lesions EYES: normal, conjunctiva are pink and non-injected, sclera clear OROPHARYNX:no exudate, no erythema and lips, buccal mucosa, and tongue normal  NECK: supple, thyroid normal size, non-tender, without nodularity LYMPH:  no palpable lymphadenopathy in the cervical, axillary or inguinal LUNGS: clear to auscultation and percussion with normal breathing effort HEART: regular rate & rhythm and no murmurs and no lower extremity edema ABDOMEN:abdomen soft, non-tender and normal bowel sounds Musculoskeletal:no cyanosis of digits and no clubbing  PSYCH: alert & oriented x 3 with fluent speech NEURO: no focal motor/sensory  deficits  LABORATORY DATA:  I have reviewed the data as listed He was noted also with mild anemia.  RADIOGRAPHIC STUDIES: I have personally reviewed the radiological images as listed and agreed with the findings in the report.  ASSESSMENT:  He was diagnosed with left lower extremity DVT with massive bilateral pulmonary emboli. This is likely precipitated by long-distance travel and injury to the left leg prior to the diagnosis. He had documented right heart strain requiring placement of IVC filter and systemic TPA with resolution of his symptoms. IVC filter was subsequently removed and he remained on Xarelto without bleeding complications. The patient tested positive for factor V Leiden mutation,  heterozygous state as well as presence of lupus anticoagulants.  PLAN:  #1 DVT and PE I reviewed with the patient about the plan for care for provoked DVT/PE.  This last episode of blood clot appeared to be provoked. We discussed about the pros and cons about testing for thrombophilia disorder. his current anticoagulation therapy will interfere with some the tests and it is not possible to interpret the test results.  Taking him off the anticoagulation therapy to do the tests may precipitate another thrombotic event. I do not see a reason to order excessive testing to screen for thrombophilia disorder as it would not change our management.  The goal of anticoagulation therapy is for minimum 12 months and possibly longer depending on future blood work, especially whether he still has lingering presence of lupus anticoagulants.  He agreed.   We discussed about various options of anticoagulation therapies including warfarin, low molecular weight heparin such as Lovenox or newer agents such as Rivaroxaban. Some of the risks and benefits discussed including costs involved, the need for monitoring, risks of life-threatening bleeding/hospitalization, reversibility of each agent in the event of bleeding or overdose, safety profile of each drug and taking into account other social issues such as ease of administration of medications, etc. Ultimately, we have made an informed decision for the patient to continue his treatment with Xarelto until next visit in January 2015.  At present time, I do not recommend the patient to use elastic compression stockings. He is fortunate and does not appear to have residual signs of leg edema or thrombophlebitis.  #2 heterozygous mutation for factor V Leiden mutation Another main issue we discussed today included the role of screening other family members for thrombophilia disorder. At present time, I would not recommend testing the patient's family members as it would not  benefit them. Apparently, both his children were tested and both of them are carriers. I explained to him that thrombophilia disorder is a genetic predisposition which increases an individual's risk for a thrombotic event, NOT a disease.  We discussed the implications of genetic screening including the possibility of uninsurability, costs involved, emotional distress and possible discrimination at various levels for the affected individual.  Rather than genetic screening, one can possibly benefit from genetic counseling or dissemination of appropriate reading materials to educate other family members. I addressed his question regarding his daughter whether she can be on birth-control pill. I do not feel that the presence of her heterozygous factor V Leidenmutation is an absolute contraindication for her to be on control pill as long as she is aware of the risk and other risk factors that might predispose her to get blood clots.  #3 lupus anticoagulants I felt that this are the major risk factors for him for recurrence of blood clots. I plan to reorder the test in the  future once we are ready to take him off anticoagulation treatment. If he still has presence of high titer of lupus anticoagulants, I would not recommend him to come off anticoagulation therapy and explained to him the rationale and he agreed.  #4 Anemia He went to [facility] for [facility] opinion and was recommended PNH screen. I recommend he has his CBC rechecked first and if his anemia resolves, I do not think a PNH screen would be indicated and he agreed. He will get his blood work rechecked in his primary care physician's office in the near future.  Finally, at the end of our consultation today, I reinforced the importance of preventive strategies such as avoiding hormonal supplement, avoiding cigarette smoking, keeping up-to-date with screening programs for early cancer detection, frequent ambulation for long distance travel and  aggressive DVT prophylaxis in all surgical settings.  All questions were answered. The patient knows to call the clinic with any problems, questions or concerns. I spent 55 minutes counseling the patient face to face. The total time spent in the appointment was 60 minutes and more than 50% was on counseling.     [facility], Shaarav Ripple, MD 04/24/2014 10:27 PM

## 2014-04-29 ENCOUNTER — Telehealth: Payer: Self-pay | Admitting: Hematology and Oncology

## 2014-04-29 NOTE — Telephone Encounter (Signed)
lvm for pt regarding to Jan 2016 appt...mailed pt appt sched/avs and lette

## 2014-05-08 ENCOUNTER — Telehealth: Payer: Self-pay | Admitting: *Deleted

## 2014-05-08 NOTE — Telephone Encounter (Signed)
Message copied by Marlon Pel on Thu May 08, 2014 10:20 AM ------      Message from: Silvano Rusk E      Created: Thu May 08, 2014 10:06 AM      Regarding: RE: Hold xarelto 1-2 days before EGD colonoscopy       Thanks      We have been ok doing 2 days            I place prophylactic clips if I take off big polyps            carl      ----- Message -----         From: Heath Lark, MD         Sent: 05/08/2014   9:58 AM           To: Gatha Mayer, MD      Subject: RE: Hold xarelto 1-2 days before EGD colonos#            I usually recommend 3 days off prior      Restart same night if no big polyps. If big polyps, restart 24 hours post      No bridging therapy is needed      Encourage him to walk frequent.             Thanks      ----- Message -----         From: Gatha Mayer, MD         Sent: 05/08/2014   8:55 AM           To: Heath Lark, MD      Subject: Hold xarelto 1-2 days before EGD colonoscopy             Hi Ni,            Joe Molina is scheduled to have an EGD/colonoscopy in early July.            It would be optimal for him to be off Xarelto for the procedures - in particular the colonoscopy.            I know he has risks to clot again but want your advice/recommendation re: holding the xarelto            We have been holding it 1-2 days before            Would it be acceptable to hold day before and restart either after procedures same day or next day (if I remove large polyps)?                  Thanks            Silvano Rusk      [facility]      Cell [phone]             ------

## 2014-05-08 NOTE — Telephone Encounter (Signed)
Sherry or PJ,   This pt is coming tomorrow for his PV- colonoscopy is scheduled for 05-15-14.  He is on Xarelto for a P.E.  Dr. Carlean Purl is aware of this- pt communicated with him in January and Dr. Carlean Purl delayed his procedures until now.  Could you please get in touch with his physician [id number] for Xarelto for his procedure?  Thank you, Cyril Mourning

## 2014-05-08 NOTE — Telephone Encounter (Signed)
Dr. Carlean Purl please review note from Dr. Alvy Bimler about anticoagulation and not stopping for 1 year.

## 2014-05-09 ENCOUNTER — Ambulatory Visit (AMBULATORY_SURGERY_CENTER): Payer: Self-pay | Admitting: *Deleted

## 2014-05-09 VITALS — Ht 70.0 in | Wt 187.2 lb

## 2014-05-09 DIAGNOSIS — Z86010 Personal history of colonic polyps: Secondary | ICD-10-CM

## 2014-05-09 DIAGNOSIS — K219 Gastro-esophageal reflux disease without esophagitis: Secondary | ICD-10-CM

## 2014-05-09 MED ORDER — NA SULFATE-K SULFATE-MG SULF 17.5-3.13-1.6 GM/177ML PO SOLN: 1.0000 | Freq: Once | ORAL | Status: DC

## 2014-05-09 NOTE — Progress Notes (Signed)
No allergies to eggs or soy. No problems with anesthesia.  Pt given Emmi instructions for colonoscopy  No oxygen use  No diet drug use  

## 2014-05-15 ENCOUNTER — Encounter: Payer: 59 | Admitting: Internal Medicine

## 2014-05-30 ENCOUNTER — Encounter: Payer: Self-pay | Admitting: Family Medicine

## 2014-05-30 ENCOUNTER — Ambulatory Visit (INDEPENDENT_AMBULATORY_CARE_PROVIDER_SITE_OTHER): Payer: 59 | Admitting: Family Medicine

## 2014-05-30 VITALS — BP 132/74 | HR 72 | Temp 98.6°F

## 2014-05-30 DIAGNOSIS — H11009 Unspecified pterygium of unspecified eye: Secondary | ICD-10-CM

## 2014-05-30 DIAGNOSIS — H11001 Unspecified pterygium of right eye: Secondary | ICD-10-CM

## 2014-05-30 MED ORDER — TOBRAMYCIN-DEXAMETHASONE 0.3-0.1 % OP SUSP: 2.0000 [drp] | OPHTHALMIC | Status: DC

## 2014-05-30 NOTE — Progress Notes (Signed)
l °

## 2014-05-30 NOTE — Progress Notes (Signed)
   Subjective:    Patient ID: Joe Molina, male    DOB: 09-07-1956, 58 y.o.   MRN: [id number]  HPI Here for 2 days of mild pain and redness in the right eye. No crusting or DC. No change in vision. No light sensitivity. He does not wear contacts.    Review of Systems  Constitutional: Negative.   HENT: Negative.   Eyes: Positive for pain and redness. Negative for photophobia, discharge, itching and visual disturbance.  Respiratory: Negative.   Neurological: Negative for headaches.       Objective:   Physical Exam  Constitutional: He appears well-developed and well-nourished.  HENT:  Right Ear: External ear normal.  Left Ear: External ear normal.  Nose: Nose normal.  Mouth/Throat: Oropharynx is clear and moist.  Eyes: EOM are normal. Pupils are equal, round, and reactive to light.  His right conjunctiva has a large pterygium on the nasal side which appears to be swollen and inflamed. The remainder of the conjunctiva is clear. No photophobia   Lymphadenopathy:    He has no cervical adenopathy.          Assessment & Plan:  He has an inflamed pterygium. Use Tobradex drops and cool compresses. Recheck prn

## 2014-06-04 ENCOUNTER — Encounter: Payer: Self-pay | Admitting: Internal Medicine

## 2014-06-04 ENCOUNTER — Ambulatory Visit (AMBULATORY_SURGERY_CENTER): Payer: 59 | Admitting: Internal Medicine

## 2014-06-04 VITALS — BP 116/76 | HR 45 | Temp 98.0°F | Resp 23 | Ht 70.0 in | Wt 187.0 lb

## 2014-06-04 DIAGNOSIS — Z86010 Personal history of colonic polyps: Secondary | ICD-10-CM

## 2014-06-04 DIAGNOSIS — R933 Abnormal findings on diagnostic imaging of other parts of digestive tract: Secondary | ICD-10-CM

## 2014-06-04 DIAGNOSIS — K209 Esophagitis, unspecified without bleeding: Secondary | ICD-10-CM

## 2014-06-04 DIAGNOSIS — IMO0001 Reserved for inherently not codable concepts without codable children: Secondary | ICD-10-CM

## 2014-06-04 DIAGNOSIS — K219 Gastro-esophageal reflux disease without esophagitis: Secondary | ICD-10-CM

## 2014-06-04 DIAGNOSIS — D126 Benign neoplasm of colon, unspecified: Secondary | ICD-10-CM

## 2014-06-04 HISTORY — PX: COLONOSCOPY: SHX174

## 2014-06-04 MED ORDER — SODIUM CHLORIDE 0.9 % IV SOLN: 500.0000 mL | INTRAVENOUS | Status: DC

## 2014-06-04 NOTE — Op Note (Signed)
[facility]  [address] [place], [address]   ENDOSCOPY PROCEDURE REPORT  PATIENT: Joe Molina, Joe Molina  MR#: [id number] BIRTHDATE: 13-Mar-1956 , 57  yrs. old GENDER: Male ENDOSCOPIST: Gatha Mayer, MD, [facility] PROCEDURE DATE:  06/04/2014 PROCEDURE:  EGD w/ biopsy ASA CLASS:     Class III INDICATIONS:  Barrett's screening.   History of esophageal reflux. MEDICATIONS: propofol (Diprivan) 200mg  IV, MAC sedation, administered by CRNA, and These medications were titrated to patient response per physician's verbal order TOPICAL ANESTHETIC: none  DESCRIPTION OF PROCEDURE: After the risks benefits and alternatives of the procedure were thoroughly explained, informed consent was obtained.  The LB [id number] endoscope was introduced through the mouth and advanced to the second portion of the duodenum. Without limitations.  The instrument was slowly withdrawn as the mucosa was fully examined.     ESOPHAGUS: There was evidence of suspected Barrett's esophagus at the gastroesophageal junction.  Irregular Z-line with 2 possible columnar tongues. Multiple biopsies were performed using cold forceps.  Sample sent for histology.  The remainder of the upper endoscopy exam was otherwise normal. Retroflexed views revealed no abnormalities.     The scope was then withdrawn from the patient and the procedure completed.  COMPLICATIONS: There were no complications. ENDOSCOPIC IMPRESSION: 1.   There was evidence of suspected Barrett's esophagus; multiple biopsies 2.   The remainder of the upper endoscopy exam was otherwise normal  RECOMMENDATIONS: Await biopsy results   eSigned:  Gatha Mayer, MD, [facility] 06/04/2014 3:55 PM   CC:The Patient and Laurey Morale, MD

## 2014-06-04 NOTE — Patient Instructions (Addendum)
The upper endoscopy showed changes that might be Barrett's esophagus. This is a change in the lining of the esophagus that can be pre-cancerous but that is very rare so please do not worry. I will let you know.  There was one tiny polyp that I removed - sent to pathology. You should not need another colonoscopy for 5 years.  Resume Xarelto today please.  I will let you know pathology results and when to have another routine colonoscopy and/or upper endoscopy.  I appreciate the opportunity to care for you. Gatha Mayer, MD, FACG  YOU HAD AN ENDOSCOPIC PROCEDURE TODAY AT [facility]: Refer to the procedure report that was given to you for any specific questions about what was found during the examination.  If the procedure report does not answer your questions, please call your gastroenterologist to clarify.  If you requested that your care partner not be given the details of your procedure findings, then the procedure report has been included in a sealed envelope for you to review at your convenience later.  YOU SHOULD EXPECT: Some feelings of bloating in the abdomen. Passage of more gas than usual.  Walking can help get rid of the air that was put into your GI tract during the procedure and reduce the bloating. If you had a lower endoscopy (such as a colonoscopy or flexible sigmoidoscopy) you may notice spotting of blood in your stool or on the toilet paper. If you underwent a bowel prep for your procedure, then you may not have a normal bowel movement for a few days.  DIET: Your first meal following the procedure should be a light meal and then it is ok to progress to your normal diet.  A half-sandwich or bowl of soup is an example of a good first meal.  Heavy or fried foods are harder to digest and may make you feel nauseous or bloated.  Likewise meals heavy in dairy and vegetables can cause extra gas to form and this can also increase the bloating.  Drink plenty of fluids but  you should avoid alcoholic beverages for 24 hours.  ACTIVITY: Your care partner should take you home directly after the procedure.  You should plan to take it easy, moving slowly for the rest of the day.  You can resume normal activity the day after the procedure however you should NOT DRIVE or use heavy machinery for 24 hours (because of the sedation medicines used during the test).    SYMPTOMS TO REPORT IMMEDIATELY: A gastroenterologist can be reached at any hour.  During normal business hours, 8:30 AM to 5:00 PM Monday through Friday, call [phone].  After hours and on weekends, please call the GI answering service at [phone] who will take a message and have the physician on call contact you.   Following lower endoscopy (colonoscopy or flexible sigmoidoscopy):  Excessive amounts of blood in the stool  Significant tenderness or worsening of abdominal pains  Swelling of the abdomen that is new, acute  Fever of 100F or higher  Following upper endoscopy (EGD)  Vomiting of blood or coffee ground material  New chest pain or pain under the shoulder blades  Painful or persistently difficult swallowing  New shortness of breath  Fever of 100F or higher  Black, tarry-looking stools  FOLLOW UP: If any biopsies were taken you will be contacted by phone or by letter within the next 1-3 weeks.  Call your gastroenterologist if you have not heard about  the biopsies in 3 weeks.  Our staff will call the home number listed on your records the next business day following your procedure to check on you and address any questions or concerns that you may have at that time regarding the information given to you following your procedure. This is a courtesy call and so if there is no answer at the home number and we have not heard from you through the emergency physician on call, we will assume that you have returned to your regular daily activities without  incident.  SIGNATURES/CONFIDENTIALITY: You and/or your care partner have signed paperwork which will be entered into your electronic medical record.  These signatures attest to the fact that that the information above on your After Visit Summary has been reviewed and is understood.  Full responsibility of the confidentiality of this discharge information lies with you and/or your care-partner.   Polyp and diverticulosis information given.  Await biopsy results for upper endoscopy and lower endoscopy.

## 2014-06-04 NOTE — Op Note (Signed)
  [employer]. [place], [id number]   COLONOSCOPY PROCEDURE REPORT  PATIENT: Joe Molina, Joe Molina  MR#: [id number] BIRTHDATE: November 28, 1955 , 35  yrs. old GENDER: Male ENDOSCOPIST: Gatha Mayer, MD, [facility] PROCEDURE DATE:  06/04/2014 PROCEDURE:   Colonoscopy with biopsy First Screening Colonoscopy - Avg.  risk and is 50 yrs.  old or older - No.  Prior Negative Screening - Now for repeat screening. N/A  History of Adenoma - Now for follow-up colonoscopy & has been > or = to 3 yrs.  Yes hx of adenoma.  Has been 3 or more years since last colonoscopy.  Polyps Removed Today? Yes. ASA CLASS:   Class III INDICATIONS:Patient's personal history of adenomatous colon polyps.  MEDICATIONS: There was residual sedation effect present from prior procedure, propofol (Diprivan) 150mg  IV, MAC sedation, administered by CRNA, and These medications were titrated to patient response per physician's verbal order  DESCRIPTION OF PROCEDURE:   After the risks benefits and alternatives of the procedure were thoroughly explained, informed consent was obtained.  A digital rectal exam revealed no abnormalities of the rectum, A digital rectal exam revealed no prostatic nodules, and A digital rectal exam revealed the prostate was not enlarged.   The LB [id number]  endoscope was introduced through the anus and advanced to the cecum, which was identified by both the appendix and ileocecal valve. No adverse events experienced.   The quality of the prep was excellent using Suprep  The instrument was then slowly withdrawn as the colon was fully examined.  COLON FINDINGS: A diminutive sessile polyp was found in the transverse colon.  A polypectomy was performed with cold forceps. The resection was complete and the polyp tissue was completely retrieved.   Moderate diverticulosis was noted in the sigmoid colon.   The colon mucosa was otherwise normal.   A right colon retroflexion was  performed.  Retroflexed views revealed no abnormalities. The time to cecum=2 minutes 11 seconds.  Withdrawal time=12 minutes 30 seconds.  The scope was withdrawn and the procedure completed. COMPLICATIONS: There were no complications.  ENDOSCOPIC IMPRESSION: 1.   Diminutive sessile polyp was found in the transverse colon; polypectomy was performed with cold forceps 2.   Moderate diverticulosis was noted in the sigmoid colon 3.   The colon mucosa was otherwise normal  RECOMMENDATIONS: 1.  Timing of repeat colonoscopy will be determined by pathology findings. 2.   likely 5 years Resume Xarelto today  eSigned:  Gatha Mayer, MD, [facility] 06/04/2014 4:00 PM cc: The Patient and Laurey Morale, MD

## 2014-06-04 NOTE — Progress Notes (Signed)
Called to room to assist during endoscopic procedure.  Patient ID and intended procedure confirmed with present staff. Received instructions for my participation in the procedure from the performing physician.  

## 2014-06-04 NOTE — Progress Notes (Signed)
Patient awakening,vss,report to rn 

## 2014-06-05 ENCOUNTER — Telehealth: Payer: Self-pay | Admitting: *Deleted

## 2014-06-05 NOTE — Telephone Encounter (Signed)
  Follow up Call-  Call back number 06/04/2014  Post procedure Call Back phone  # [phone]  Permission to leave phone message Yes     Patient questions:  Do you have a fever, pain , or abdominal swelling? No. Pain Score  0 *  Have you tolerated food without any problems? Yes.    Have you been able to return to your normal activities? Yes.    Do you have any questions about your discharge instructions: Diet   No. Medications  No. Follow up visit  No.  Do you have questions or concerns about your Care? No.  Actions: * If pain score is 4 or above: No action needed, pain <4.

## 2014-06-11 ENCOUNTER — Encounter: Payer: Self-pay | Admitting: Internal Medicine

## 2014-06-11 NOTE — Progress Notes (Signed)
Quick Note:  GERD changes no Barrett's Polypoid colon mucosa  Repeat colon 2020 ______

## 2014-06-19 ENCOUNTER — Encounter: Payer: 59 | Admitting: Internal Medicine

## 2014-06-30 ENCOUNTER — Other Ambulatory Visit: Payer: Self-pay | Admitting: Critical Care Medicine

## 2014-09-24 ENCOUNTER — Other Ambulatory Visit: Payer: Self-pay | Admitting: Oncology

## 2014-09-30 ENCOUNTER — Other Ambulatory Visit: Payer: Self-pay | Admitting: Family Medicine

## 2014-11-18 ENCOUNTER — Telehealth: Payer: Self-pay | Admitting: Hematology and Oncology

## 2014-11-18 NOTE — Telephone Encounter (Signed)
returned pt's call re r/s 1/12 appt. pt given new appt for 1/14 @ 1:30pm. office phone [phone].

## 2014-11-25 ENCOUNTER — Ambulatory Visit: Payer: 59 | Admitting: Hematology and Oncology

## 2014-11-27 ENCOUNTER — Ambulatory Visit (HOSPITAL_BASED_OUTPATIENT_CLINIC_OR_DEPARTMENT_OTHER): Payer: 59 | Admitting: Hematology and Oncology

## 2014-11-27 ENCOUNTER — Telehealth: Payer: Self-pay | Admitting: Hematology and Oncology

## 2014-11-27 VITALS — BP 122/74 | HR 59 | Temp 97.6°F | Resp 18 | Ht 70.0 in | Wt 190.9 lb

## 2014-11-27 DIAGNOSIS — D6862 Lupus anticoagulant syndrome: Secondary | ICD-10-CM

## 2014-11-27 DIAGNOSIS — D6851 Activated protein C resistance: Secondary | ICD-10-CM

## 2014-11-27 DIAGNOSIS — D638 Anemia in other chronic diseases classified elsewhere: Secondary | ICD-10-CM

## 2014-11-27 DIAGNOSIS — I82409 Acute embolism and thrombosis of unspecified deep veins of unspecified lower extremity: Secondary | ICD-10-CM

## 2014-11-27 DIAGNOSIS — I2699 Other pulmonary embolism without acute cor pulmonale: Secondary | ICD-10-CM

## 2014-11-27 DIAGNOSIS — M791 Myalgia: Secondary | ICD-10-CM

## 2014-11-27 DIAGNOSIS — M7918 Myalgia, other site: Secondary | ICD-10-CM | POA: Insufficient documentation

## 2014-11-27 IMAGING — CT CT ANGIO CHEST
2 of 6 series · 18 of 36 positions shown · IV contrast (OMNIPAQUE)
Comparison: CHEST x-ray 11/02/2013

CLINICAL DATA: Increasing shortness of breast.  Fever.

EXAM:
CT ANGIOGRAPHY CHEST WITH CONTRAST
TECHNIQUE: Multidetector CT imaging of the chest was performed using the
standard protocol during bolus administration of intravenous
contrast. Multiplanar CT image reconstructions including MIPs were
obtained to evaluate the vascular anatomy.
CONTRAST:  100mL OMNIPAQUE IOHEXOL 350 MG/ML SOLN

[Series 6: pe thins @ 1mm · axial · 0.81mm/px · z∈[+1637,+1900]mm · 17 of 293 slices shown]
[im 15/293  lung]
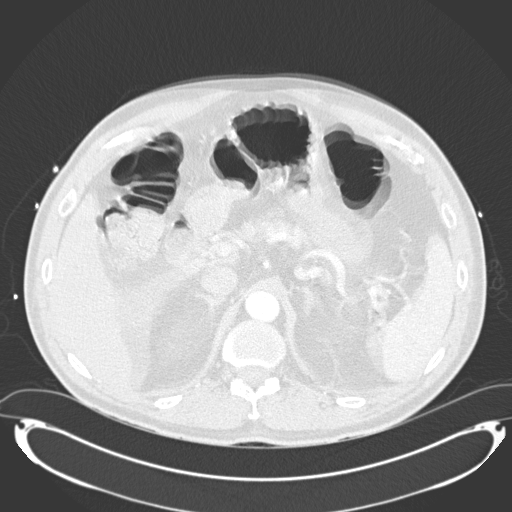
[im 30/293  mediastinal]
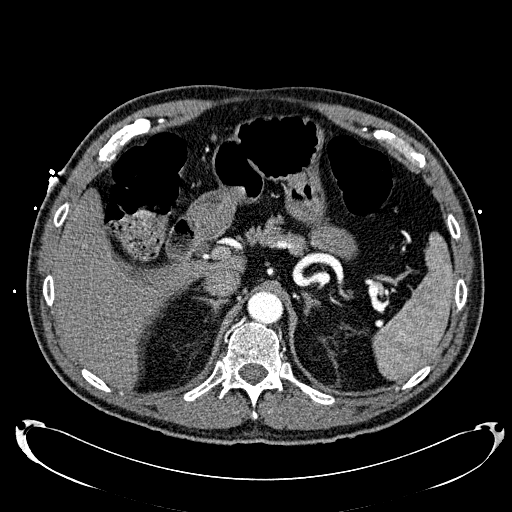
[im 44/293  lung]
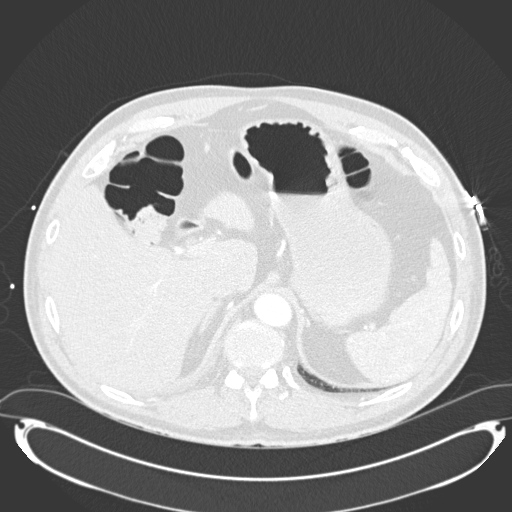
[im 59/293  mediastinal]
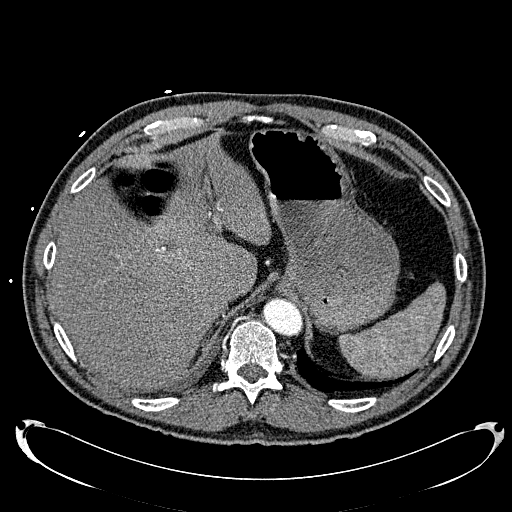
[im 88/293  lung]
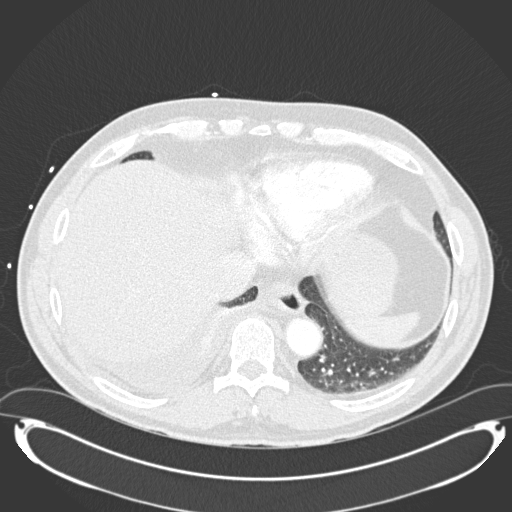
[im 103/293  mediastinal]
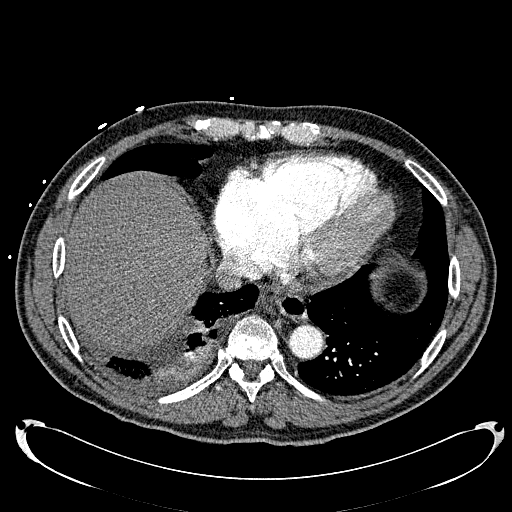
[im 117/293  lung]
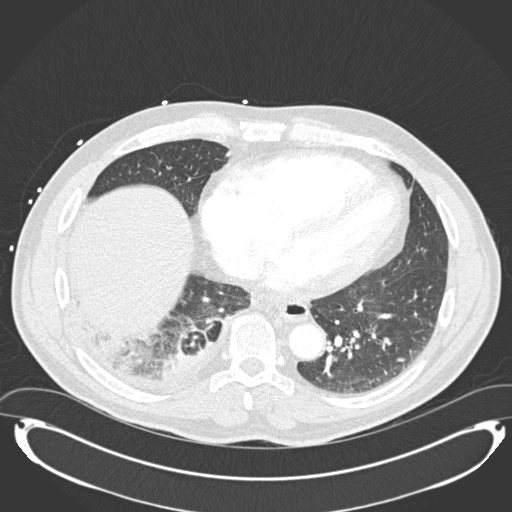
[im 132/293  mediastinal]
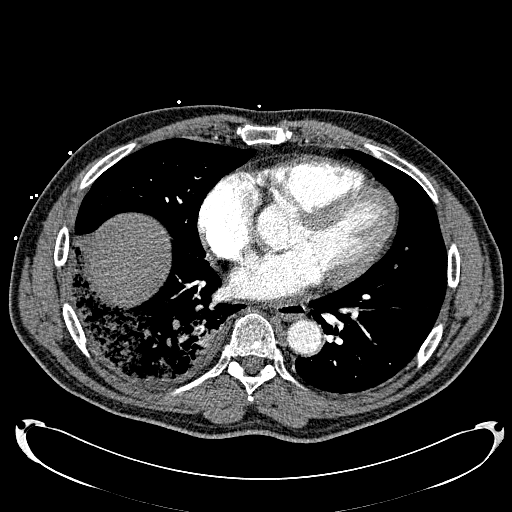
[im 147/293  lung]
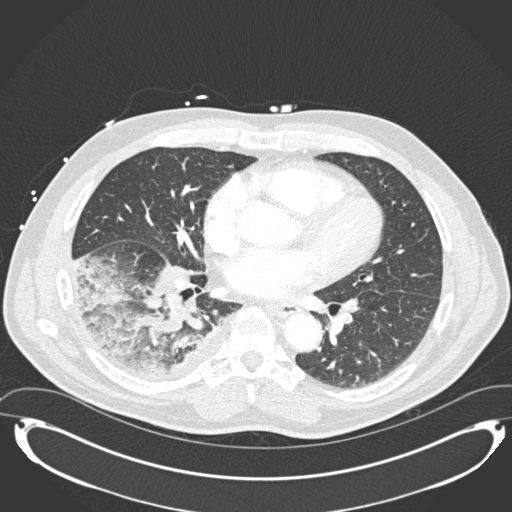
[im 161/293  mediastinal]
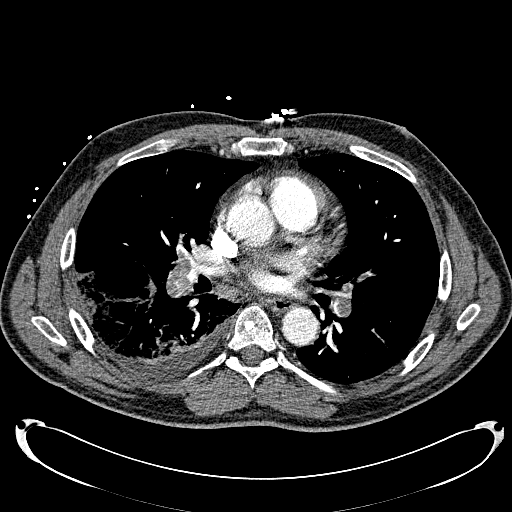
[im 176/293  lung]
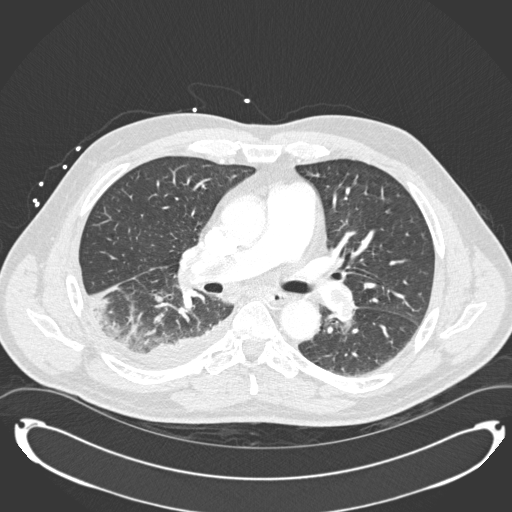
[im 190/293  mediastinal]
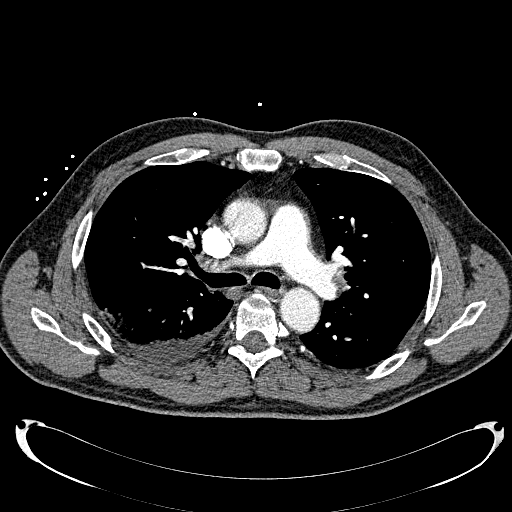
[im 205/293  lung]
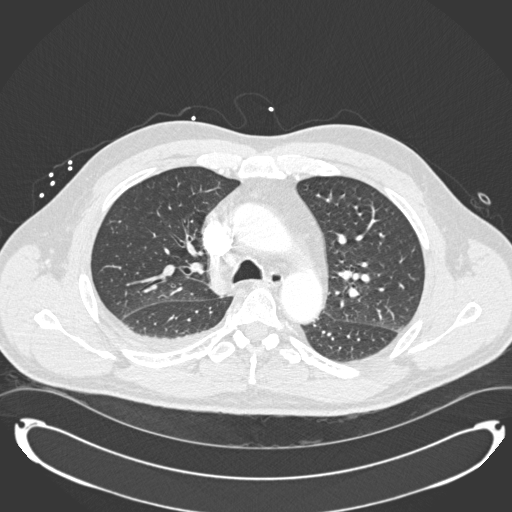
[im 234/293  mediastinal]
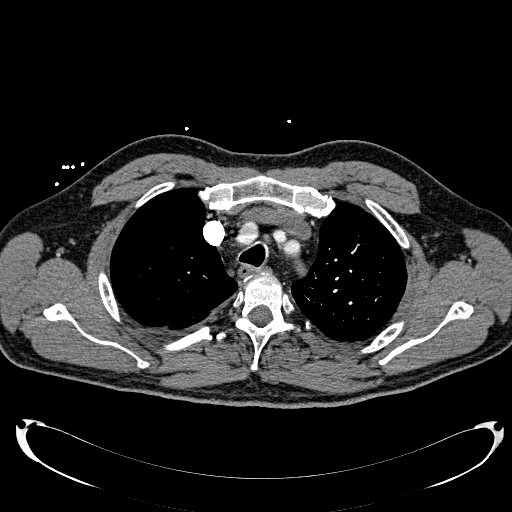
[im 249/293  lung]
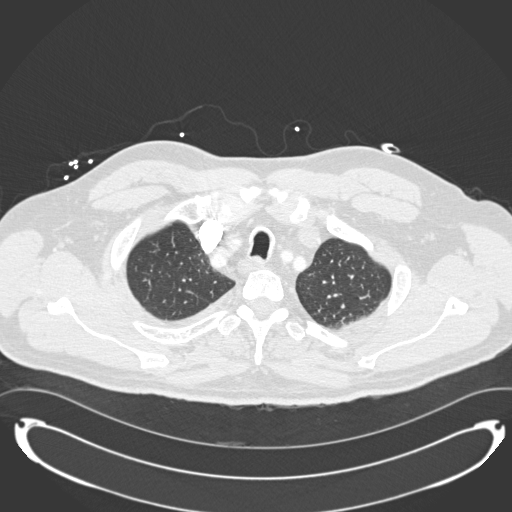
[im 263/293  mediastinal]
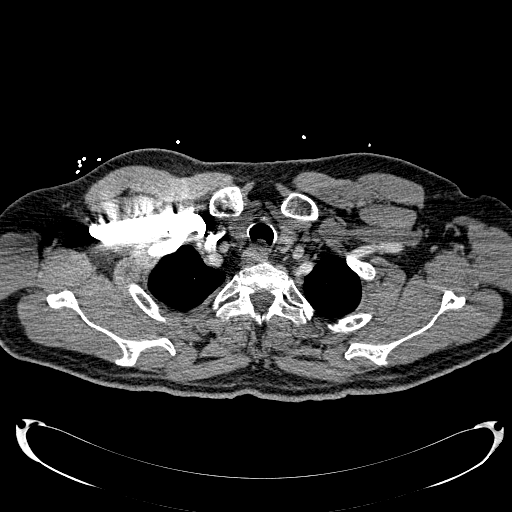
[im 278/293  lung]
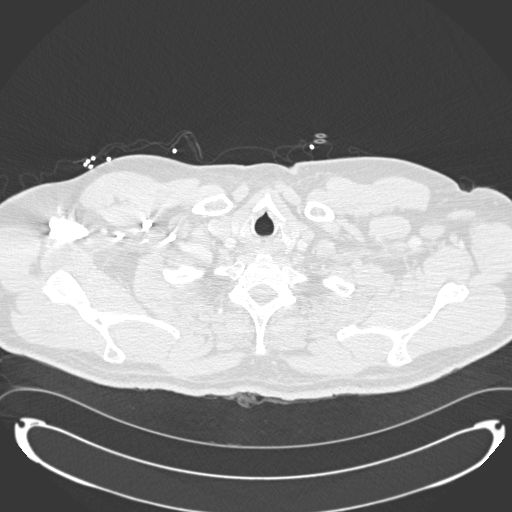

[Series 602: <mpr thick range> · coronal · 0.81mm/px · 1 of 146 slices shown]
[im 73/146  mediastinal]
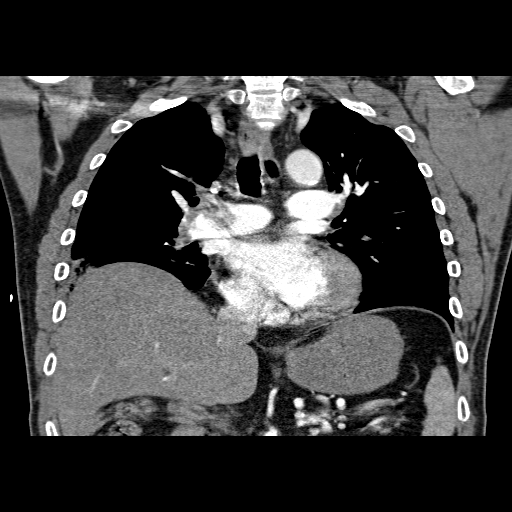

[18 of 36 positions shown; findings below may reference images not displayed]

FINDINGS: There are large central pulmonary emboli with extension into both
upper and lower lobes and likely into the right middle lobe as well.
The largest clot burden is in the lower lobes bilaterally, right
greater than left. Associated airspace disease within the right
lower lobe with small right pleural effusion. This could be related
to early pulmonary infarct/hemorrhage. There is evidence of right
heart strain with elevated RV:LV ratio of 1.8 (normal is less than
0.9). Heart is mildly enlarged. Aorta is normal caliber. No
mediastinal, hilar, or axillary adenopathy. Chest wall soft tissues
are unremarkable. Imaging into the upper abdomen shows no acute
findings.

No acute bony abnormality. Sclerotic focus within a mid thoracic
vertebral body likely reflects a bone island.

Review of the MIP images confirms the above findings.
IMPRESSION: Positive for acute PE with CT evidence of right heart strain (RV/LV
Ratio = 1.8) consistent with at least submassive (intermediate
risk)PE. The presence of right heart strain has been associated with
an increased risk of morbidity and mortality. Consultation with
Pulmonary and [REDACTED] is recommended.

Airspace disease within the right lower lung could reflect pulmonary
infarcts/hemorrhage. Small right pleural effusion.

Critical Value/emergent results were called by telephone at the time
of interpretation on 11/02/2013 at [DATE] to Dr. WANNER DANERI ENAMIRADO ,
who verbally acknowledged these results.

## 2014-11-27 NOTE — Assessment & Plan Note (Signed)
We discussed implication of factor V Leiden mutation to family members. After he complete current course of anticoagulation therapy, he will take aspirin

## 2014-11-27 NOTE — Progress Notes (Signed)
[facility] OFFICE PROGRESS NOTE  Laurey Morale, MD SUMMARY OF HEMATOLOGIC HISTORY: Joe Molina did quite a bit of driving around Thanksgiving to visit family.  He recalled 5 trips to [place] which averaged approximately 5 hours each and a long trip to [place] which was about 7 hours.  Prior to that, he also have an accidental injury whereby he hit his left ankle causing associated tenderness on his calf and swelling at the ankle, right around his long trip to [place]. He was careful and recalled making frequent stops. Nevertheless he developed some calf pain in December, which he paid little attention to. On 10/28/2013 he presented to his primary care physician, Dr. Sarajane Jews, complaining of what seemed like flulike symptoms. He was started on a Z-Pak. On 11/01/2013 however he felt considerably worse and presented to the emergency room with fever, cough, shortness of breath, and pleuritic chest pain. He was found to have a leukocytosis and a right lower lobe infiltrate on the chest x-ray. A preliminary diagnosis of community acquired pneumonia was made and he was admitted.  However, on the next day, 11/02/2013, he was feeling no better and Dr. Chase Caller obtained a CT angiogram which showed a large central pulmonary embolus with bilateral extension, with evidence of right heart strain (the RV: LL ratio was 1.8, with normal being less than 0.9.) Doppler ultrasound the same day documented an acute DVT in the left lower extremity. The patient was started on intravenous heparin 11/02/2013. However, within 48 hours his condition had not significantly improved and therefore, after appropriate discussion, he had an IVC filter placed and received systemic tPA with Alteplase at 100 mg IV.  Casmer reports that within a few hours most of his symptoms had largely resolved, and he could breathe much better. Rivaroxaban/ Xarelto was started on December 23 at the usual starting dose of 15 mg twice a day. He is  tolerating it well, with no evidence of bleeding problems and no recurrence of his symptoms. He started the 20 mg daily dose 11/28/2013.  Workup during his admission showed presence of lupus anticoagulant and heterozygous factor V Leiden mutation present. Other thrombophilia workup also showed negative prothrombin gene mutation study, presence of high titer of beta-2 glycoprotein antibody and mildly elevated anticardiolipin antibodies. 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 He had no prior history or diagnosis of cancer. His age appropriate screening programs are up-to-date. He had prior surgeries before and never had perioperative thromboembolic events. He does not take testosterone supplements. Currently, he feels well. The patient denies any recent signs or symptoms of bleeding such as spontaneous epistaxis, hematuria or hematochezia. He denies lower extremity swelling, warmth, tenderness & erythema.  He denies recent chest pain on exertion, shortness of breath on minimal exertion, pre-syncopal episodes, hemoptysis, or palpitation. He states that his mother had history of blood clots. There is no family history of miscarriages. The patient remained on anticoagulation therapy was Xarelto since January 2015 INTERVAL HISTORY: JOAHAN SWATZELL 59 y.o. male returns for further follow-up. The patient denies any recent signs or symptoms of bleeding such as spontaneous epistaxis, hematuria or hematochezia. He complained of intermittent muscular wall pain in his abdomen, worse with certain posture. Denies any change in bowel habits.  I have reviewed the past medical history, past surgical history, social history and family history with the patient and they are unchanged from previous note.  ALLERGIES:  is allergic to codeine sulfate.  MEDICATIONS:  Current Outpatient Prescriptions   Medication Sig Dispense Refill  . cholecalciferol (VITAMIN D) 1000 UNITS tablet Take 1,000 Units by mouth daily.    Marland Kitchen lisinopril (PRINIVIL,ZESTRIL) 10 MG tablet TAKE 1 TABLET BY MOUTH DAILY. 90 tablet 1  . Multiple Vitamin (MULTIVITAMIN) tablet Take 1 tablet by mouth daily. 50 plus    . Omega-3 Fatty Acids (FISH OIL PO) Take 1 capsule by mouth daily.     . pantoprazole (PROTONIX) 40 MG tablet TAKE 1 TABLET BY MOUTH DAILY. 90 tablet 1  . XARELTO 20 MG TABS tablet TAKE 1 TABLET BY MOUTH ONCE DAILY WITH SUPPER 30 tablet 4   No current facility-administered medications for this visit.     REVIEW OF SYSTEMS:   Constitutional: Denies fevers, chills or night sweats Eyes: Denies blurriness of vision Ears, nose, mouth, throat, and face: Denies mucositis or sore throat Respiratory: Denies cough, dyspnea or wheezes Cardiovascular: Denies palpitation, chest discomfort or lower extremity swelling Gastrointestinal:  Denies nausea, heartburn or change in bowel habits Skin: Denies abnormal skin rashes Lymphatics: Denies new lymphadenopathy or easy bruising Neurological:Denies numbness, tingling or new weaknesses Behavioral/Psych: Mood is stable, no new changes  All other systems were reviewed with the patient and are negative.  PHYSICAL EXAMINATION: ECOG PERFORMANCE STATUS: 0 - Asymptomatic  Filed Vitals:   11/27/14 1337  BP: 122/74  Pulse: 59  Temp: 97.6 F (36.4 C)  Resp: 18   Filed Weights   11/27/14 1337  Weight: 190 lb 14.4 oz (86.592 kg)    GENERAL:alert, no distress and comfortable SKIN: skin color, texture, turgor are normal, no rashes or significant lesions EYES: normal, Conjunctiva are pink and non-injected, sclera clear OROPHARYNX:no exudate, no erythema and lips, buccal mucosa, and tongue normal  NECK: supple, thyroid normal size, non-tender, without nodularity LYMPH:  no palpable lymphadenopathy in the cervical, axillary or inguinal LUNGS: clear to auscultation and  percussion with normal breathing effort HEART: regular rate & rhythm and no murmurs and no lower extremity edema ABDOMEN:abdomen soft, mild tenderness on palpation in the left lower quadrant of the left costal margin, no rebound or guarding, normal bowel sounds Musculoskeletal:no cyanosis of digits and no clubbing  NEURO: alert & oriented x 3 with fluent speech, no focal motor/sensory deficits  LABORATORY DATA:  I have reviewed the data as listed No results found for this or any previous visit (from the past 48 hour(s)).  Lab Results  Component Value Date   WBC 3.2* 12/03/2013   HGB 12.9* 12/03/2013   HCT 37.8* 12/03/2013   MCV 93.8 12/03/2013   PLT 211 12/03/2013    ASSESSMENT & PLAN:  Subacute massive pulmonary embolism The patient will complete one year's worth of anticoagulation therapy at the end of this month. After that, I recommend waiting a couple weeks. I will redraw blood count and d-dimer then to reassess. After he complete current prescription of Xarelto, I recommend he start taking 162 mg of aspirin. If his d-dimer or lupus anticoagulant panel is abnormal, I will call the patient and resume anticoagulation therapy. Otherwise, he will go on aspirin therapy indefinitely. We stressed the importance of prophylactic anticoagulation therapy in the events of surgery. I also recommend he change to full dose aspirin if he plans to travel again.   Lupus anticoagulant disorder He had abnormal panel last year. I will recheck again next month. I will call the patient with test results.   Factor V Leiden mutation We discussed  implication of factor V Leiden mutation to family members. After he complete current course of anticoagulation therapy, he will take aspirin   Anemia in other chronic diseases classified elsewhere He had history of anemia, likely due to anemia of chronic disease. I will recheck his blood work again next month.   Abdominal muscle pain He complained of  intermittent muscular wall pain, likely related to heavy lifting/strain as exercise. I reassured the patient. Recommend against heavy lifting over the next couple weeks.    All questions were answered. The patient knows to call the clinic with any problems, questions or concerns. No barriers to learning was detected.  I spent 25 minutes counseling the patient face to face. The total time spent in the appointment was 30 minutes and more than 50% was on counseling.     Cassandra Harbold, MD @T (<PARAMETER> error)@ 2:18 PM

## 2014-11-27 NOTE — Assessment & Plan Note (Signed)
He complained of intermittent muscular wall pain, likely related to heavy lifting/strain as exercise. I reassured the patient. Recommend against heavy lifting over the next couple weeks.

## 2014-11-27 NOTE — Assessment & Plan Note (Signed)
He had abnormal panel last year. I will recheck again next month. I will call the patient with test results.

## 2014-11-27 NOTE — Assessment & Plan Note (Signed)
The patient will complete one year's worth of anticoagulation therapy at the end of this month. After that, I recommend waiting a couple weeks. I will redraw blood count and d-dimer then to reassess. After he complete current prescription of Xarelto, I recommend he start taking 162 mg of aspirin. If his d-dimer or lupus anticoagulant panel is abnormal, I will call the patient and resume anticoagulation therapy. Otherwise, he will go on aspirin therapy indefinitely. We stressed the importance of prophylactic anticoagulation therapy in the events of surgery. I also recommend he change to full dose aspirin if he plans to travel again.

## 2014-11-27 NOTE — Telephone Encounter (Signed)
Pt confirmed labs per 01/14 POF, gave pt AVS.... KJ

## 2014-11-27 NOTE — Assessment & Plan Note (Signed)
He had history of anemia, likely due to anemia of chronic disease. I will recheck his blood work again next month.

## 2014-12-02 ENCOUNTER — Telehealth: Payer: Self-pay | Admitting: Critical Care Medicine

## 2014-12-02 ENCOUNTER — Other Ambulatory Visit (HOSPITAL_COMMUNITY): Payer: Self-pay | Admitting: Cardiology

## 2014-12-02 DIAGNOSIS — Z86718 Personal history of other venous thrombosis and embolism: Secondary | ICD-10-CM

## 2014-12-02 DIAGNOSIS — M79605 Pain in left leg: Secondary | ICD-10-CM

## 2014-12-02 DIAGNOSIS — M79662 Pain in left lower leg: Secondary | ICD-10-CM

## 2014-12-02 NOTE — Telephone Encounter (Signed)
Doppler scheduled@[facility] 1/201/16@2 :30pm pt Joe Molina

## 2014-12-02 NOTE — Telephone Encounter (Signed)
Noted  

## 2014-12-02 NOTE — Telephone Encounter (Signed)
Pt needs venous doppler of left lower extremity either Wed or Friday PM   See order

## 2014-12-03 ENCOUNTER — Ambulatory Visit (HOSPITAL_COMMUNITY): Payer: 59 | Attending: Cardiovascular Disease | Admitting: Cardiology

## 2014-12-03 DIAGNOSIS — I82502 Chronic embolism and thrombosis of unspecified deep veins of left lower extremity: Secondary | ICD-10-CM | POA: Diagnosis present

## 2014-12-03 DIAGNOSIS — M79605 Pain in left leg: Secondary | ICD-10-CM | POA: Insufficient documentation

## 2014-12-03 NOTE — Progress Notes (Signed)
Left lower venous duplex performed  

## 2014-12-03 NOTE — Telephone Encounter (Signed)
Opened in error

## 2014-12-05 ENCOUNTER — Other Ambulatory Visit: Payer: Self-pay | Admitting: Critical Care Medicine

## 2014-12-05 MED ORDER — XARELTO 20 MG PO TABS: ORAL_TABLET | ORAL | Status: DC

## 2014-12-08 NOTE — Telephone Encounter (Signed)
Xarelto 20 mg rx sent to pharm by PW on 12/05/14.

## 2014-12-29 ENCOUNTER — Other Ambulatory Visit (HOSPITAL_BASED_OUTPATIENT_CLINIC_OR_DEPARTMENT_OTHER): Payer: 59

## 2014-12-29 DIAGNOSIS — D638 Anemia in other chronic diseases classified elsewhere: Secondary | ICD-10-CM

## 2014-12-29 DIAGNOSIS — D6862 Lupus anticoagulant syndrome: Secondary | ICD-10-CM

## 2014-12-29 DIAGNOSIS — I82409 Acute embolism and thrombosis of unspecified deep veins of unspecified lower extremity: Secondary | ICD-10-CM

## 2014-12-29 LAB — CBC WITH DIFFERENTIAL/PLATELET
BASO%: 1.1 % (ref 0.0–2.0)
Basophils Absolute: 0 10*3/uL (ref 0.0–0.1)
EOS ABS: 0.1 10*3/uL (ref 0.0–0.5)
EOS%: 2.7 % (ref 0.0–7.0)
HCT: 45 % (ref 38.4–49.9)
HGB: 15.1 g/dL (ref 13.0–17.1)
LYMPH%: 28.6 % (ref 14.0–49.0)
MCH: 32.4 pg (ref 27.2–33.4)
MCHC: 33.6 g/dL (ref 32.0–36.0)
MCV: 96.7 fL (ref 79.3–98.0)
MONO#: 0.4 10*3/uL (ref 0.1–0.9)
MONO%: 10.2 % (ref 0.0–14.0)
NEUT%: 57.4 % (ref 39.0–75.0)
NEUTROS ABS: 2 10*3/uL (ref 1.5–6.5)
Platelets: 169 10*3/uL (ref 140–400)
RBC: 4.65 10*6/uL (ref 4.20–5.82)
RDW: 11.9 % (ref 11.0–14.6)
WBC: 3.5 10*3/uL — AB (ref 4.0–10.3)
lymph#: 1 10*3/uL (ref 0.9–3.3)

## 2014-12-30 LAB — LUPUS ANTICOAGULANT PANEL
DRVVT: 28.2 secs (ref <42.9)
Lupus Anticoagulant: NOT DETECTED
PTT LA: 33.8 s (ref 28.0–43.0)

## 2014-12-30 LAB — D-DIMER, QUANTITATIVE (NOT AT ARMC): D DIMER QUANT: 0.27 ug{FEU}/mL (ref 0.00–0.48)

## 2015-04-20 ENCOUNTER — Telehealth: Payer: Self-pay | Admitting: Critical Care Medicine

## 2015-04-20 NOTE — Telephone Encounter (Signed)
Please advise where pt can be added to wed schedule per PW.

## 2015-04-20 NOTE — Telephone Encounter (Signed)
Pt aware.

## 2015-04-20 NOTE — Telephone Encounter (Signed)
Per pt they can come at 9:00am per crystal ok to schedule

## 2015-04-20 NOTE — Telephone Encounter (Signed)
lmtcb

## 2015-04-20 NOTE — Telephone Encounter (Signed)
The pt contacted me and desires an office vist  pls add him to wed schedule

## 2015-04-20 NOTE — Telephone Encounter (Signed)
lmomtcb for pt - Can he come in this Wed at 9:30 am?

## 2015-04-22 ENCOUNTER — Other Ambulatory Visit: Payer: Self-pay | Admitting: Family Medicine

## 2015-04-22 ENCOUNTER — Ambulatory Visit (INDEPENDENT_AMBULATORY_CARE_PROVIDER_SITE_OTHER): Payer: 59 | Admitting: Critical Care Medicine

## 2015-04-22 ENCOUNTER — Encounter: Payer: Self-pay | Admitting: Critical Care Medicine

## 2015-04-22 VITALS — BP 116/72 | HR 55 | Temp 99.1°F | Ht 70.0 in | Wt 185.0 lb

## 2015-04-22 DIAGNOSIS — M7918 Myalgia, other site: Secondary | ICD-10-CM

## 2015-04-22 DIAGNOSIS — R1032 Left lower quadrant pain: Secondary | ICD-10-CM | POA: Diagnosis not present

## 2015-04-22 DIAGNOSIS — M791 Myalgia: Secondary | ICD-10-CM

## 2015-04-22 NOTE — Patient Instructions (Signed)
Use advil or aleve for pain, if worsens or fails to subside, call us or your PCP Dr Sarajane Jews , may need an ultrasound or CT of your abdomen This is not a blood clot problem Return as needed

## 2015-04-22 NOTE — Progress Notes (Signed)
Subjective:    Patient ID: Joe Molina, male    DOB: 18-Jul-1956, 59 y.o.   MRN: [id number]  HPI 04/22/2015 Chief Complaint  Patient presents with  . Follow-up    Overall breathing is doing good. Pt c/o left sided pain.     Pt with Lower abd pain for three weeks.  Sharp stabbing if turn wrong way.  Not up higher in chest area.  No leg pain.  No edema.  No dyspnea.  No change in bowels. No melana.  No nausea or emesis. No change with meals,  But worse if hungry.  No lower hip mobility, will jog 1 mile and walk three times a week.  No pain with jogging.    Pt denies any significant sore throat, nasal congestion or excess secretions, fever, chills, sweats, unintended weight loss, pleurtic or exertional chest pain, orthopnea PND, or leg swelling   Current Medications, Allergies, Complete Past Medical History, Past Surgical History, Family History, and Social History were reviewed in [facility] record per todays encounter:  04/22/2015  Review of Systems  Constitutional: Negative.   HENT: Negative.  Negative for ear pain, postnasal drip, rhinorrhea, sinus pressure, sore throat, trouble swallowing and voice change.   Eyes: Negative.   Respiratory: Negative.  Negative for apnea, cough, choking, chest tightness, shortness of breath, wheezing and stridor.   Cardiovascular: Negative.  Negative for chest pain, palpitations and leg swelling.  Gastrointestinal: Positive for abdominal pain. Negative for nausea, vomiting and abdominal distention.  Genitourinary: Negative.   Musculoskeletal: Negative.  Negative for myalgias and arthralgias.  Skin: Negative.  Negative for rash.  Allergic/Immunologic: Negative.  Negative for environmental allergies and food allergies.  Neurological: Negative.  Negative for dizziness, syncope, weakness and headaches.  Hematological: Negative.  Negative for adenopathy. Does not bruise/bleed easily.  Psychiatric/Behavioral: Negative.  Negative for  sleep disturbance and agitation. The patient is not nervous/anxious.        Objective:   Physical Exam Filed Vitals:   04/22/15 0857  BP: 116/72  Pulse: 55  Temp: 99.1 F (37.3 C)  TempSrc: Oral  Height: 5\' 10"  (1.778 m)  Weight: 185 lb (83.915 kg)  SpO2: 96%    Gen: Pleasant, well-nourished, in no distress,  normal affect  ENT: No lesions,  mouth clear,  oropharynx clear, no postnasal drip  Neck: No JVD, no TMG, no carotid bruits  Lungs: No use of accessory muscles, no dullness to percussion, clear without rales or rhonchi  Cardiovascular: RRR, heart sounds normal, no murmur or gallops, no peripheral edema  Abdomen: Slight tenderness in left lower quadrant to deep palpation and slightly worsened with left leg raise no other process identified no organomegaly no rebound or guarding bowel sounds active  Musculoskeletal: No deformities, no cyanosis or clubbing  Neuro: alert, non focal  Skin: Warm, no lesions or rashes  No results found.        Assessment & Plan:  I personally reviewed all images and lab data in the [facility] system as well as any outside material available during this office visit and agree with the  radiology impressions.   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 Note it is my opinion the patient's current complaints are not related to venous thromboembolism disease Plan Patient was advised to  use non-steroidal anti-inflammatory's as needed for this symptom complex and the patient is to return sooner if symptoms do not resolve or worsen or else call the patient's primary care provider for this complaint.   Joe Molina was seen today for follow-up.  Diagnoses and all orders for this visit:  Abdominal pain, left lower  quadrant  Abdominal muscle pain    I had an extended discussion with the patient and or family lasting 10 minutes of a 25 minute visit including:  Symptom complex and differential diagnosis and treatment strategy

## 2015-04-22 NOTE — Telephone Encounter (Signed)
Ok to fill 

## 2015-04-22 NOTE — Assessment & Plan Note (Signed)
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 Note it is my opinion the patient's current complaints are not related to venous thromboembolism disease Plan Patient was advised to use non-steroidal anti-inflammatory's as needed for this symptom complex and the patient is to return sooner if symptoms do not resolve or worsen or else call the patient's primary care provider for this complaint.

## 2015-05-20 ENCOUNTER — Telehealth: Payer: 59 | Admitting: Family

## 2015-05-20 DIAGNOSIS — M545 Low back pain: Secondary | ICD-10-CM

## 2015-05-20 DIAGNOSIS — R509 Fever, unspecified: Secondary | ICD-10-CM

## 2015-05-20 NOTE — Progress Notes (Signed)
Based on what you shared with me it looks like you have a serious condition that should be evaluated in a face to face office visit.  If you are having a true medical emergency please call 911.  If you need an urgent face to face visit, [facility] has four urgent care centers for your convenience.  . [facility] a Provider at this Location  [address] [place], [place] [address] . 8 am to 8 pm Monday-Friday . 9 am to 7 pm Saturday-Sunday  . [facility] [place] a Provider at this Location  [address] [place], [place], [place] [address] . 8 am to 8 pm Monday-Friday . 9 am to 6 pm Saturday . 11 am to 6 pm Sunday   . [facility] [place] Get Driving Directions  [address] [place], [place] [address] . 8 am to 8 pm Monday-Friday . 9 am to 4 pm Saturday-Sunday   . [facility] (a walk in primary care provider)  [place] a Provider at this Location  [place], [place] [address] . 8 am to 8:30 pm Monday-Thursday . 8 am to 6 pm Friday . 8 am to 4 pm Saturday-Sunday   Your e-visit answers were reviewed by a board certified advanced clinical practitioner to complete your personal care plan.  Depending on the condition, your plan could have included both over the counter or prescription medications.  You will get an e-mail in the next two days asking about your experience.  I hope that your e-visit has been valuable and will speed your recovery . Thank you for choosing an e-visit.

## 2015-06-25 ENCOUNTER — Ambulatory Visit
Admission: RE | Admit: 2015-06-25 | Discharge: 2015-06-25 | Disposition: A | Discharge status: Home / Self Care | Payer: 59 | Source: Ambulatory Visit | Attending: Family Medicine | Admitting: Family Medicine

## 2015-06-25 ENCOUNTER — Ambulatory Visit (INDEPENDENT_AMBULATORY_CARE_PROVIDER_SITE_OTHER): Payer: 59 | Admitting: Sports Medicine

## 2015-06-25 ENCOUNTER — Encounter: Payer: Self-pay | Admitting: Sports Medicine

## 2015-06-25 VITALS — BP 128/91 | Ht 70.0 in | Wt 185.0 lb

## 2015-06-25 DIAGNOSIS — G8929 Other chronic pain: Secondary | ICD-10-CM

## 2015-06-25 DIAGNOSIS — M25552 Pain in left hip: Principal | ICD-10-CM

## 2015-06-25 HISTORY — DX: Other chronic pain: G89.29

## 2015-06-25 HISTORY — DX: Pain in left hip: M25.552

## 2015-06-25 MED ORDER — CYCLOBENZAPRINE HCL 10 MG PO TABS: 10.0000 mg | ORAL_TABLET | Freq: Every evening | ORAL | Status: DC | PRN

## 2015-06-25 NOTE — Progress Notes (Signed)
  Joe Molina - 59 y.o. male MRN [id number]  Date of birth: 21-Nov-1955  SUBJECTIVE:  Including CC & ROS.  Joe Molina is a 59 y.o. male who presents today for left hip pain.    Hip Pain left - patient's been having ongoing left hip pain, insidious onset about 3-4 months ago. Denies any activity or injury at that time.  Pain is located on the lateral to anterior hip and is extremely vague. He does walk and all 3-4 times per week and does not notice pain increasing with activity at that time. He does have a remote history of low back pain but denies any lumbar back pain or paresthesias shooting down his leg. Does perform core activity and is not noticed any changes with his symptoms when doing any core activity. He has not tried any medications to date for this problem.  Pt denies any current bowel/bladder problems, fever, chills, unintentional weight loss, night time awakenings secondary to pain, weakness in one or both legs.    PMHx - Updated and reviewed.  Contributory factors include: Lupus anticoagulant with previous PE. PSHx - Updated and reviewed.  Contributory factors include:  Cholecystectomy FHx - Updated and reviewed.  Contributory factors include:  Clotting disorder Medications - lisinopril, aspirin   Exam:  Filed Vitals:   06/25/15 0858  BP: 128/91    Gen: NAD Cardiorespiratory - Normal respiratory effort/rate.  RRR Hip Exam:  Pelvic alignment unremarkable to inspection and palpation. Standing hip rotation and gait without trendelenburg / unsteadiness.  Gait does show externally rotated left leg with stance phase. Greater trochanter without tenderness to palpation. No tenderness over piriformis and greater trochanter. No SI joint tenderness and normal minimal SI movement. ROM: IR: 80 Deg, ER: 80 Deg, Flexion: 120 Deg, Extension: 100 Deg, Abduction: 45 Deg, Adduction: 45 Deg Strength:  IR: 5/5, ER: 5/5, Flexion (0 and 90 degrees): 5/5, Extension: 5/5, Abduction: 5/5, Adduction:  5/5 Negative Thomas test  + FADIR.  Negative FADIR with axial compression Negative FAIR and Freiberg  Negative FABER in all directions, negative posterior shear, negative Gaenslen  Negative Noble and Ober testing  Negative SLR B/L, negative slump   Neurovascularly intact B/L LE. DTR 2/4 bilateral lower extremity. L2 through S1 dermatome strength intact bilateral lower extremity  Imaging:LS spine films show 4 functional lumbar vertebrae/ limbus vertebrae/  Degenerative disk flattening at L5/S1

## 2015-06-25 NOTE — Assessment & Plan Note (Addendum)
His H/P and exam c/w more lumbar spine problems than hip pain  - lumbar spine x-rays to evaluate for DJD  - HEP for stretching/low back pain strengthening to help with spine - Can take NSAID for pain and may consider flexeril PRN at night  - F/U in 3-4 weeks to see how doing.   I reviewed his films and called patient  We will follow this plan  Do further workup if he does not get good relief with plan above

## 2015-09-08 ENCOUNTER — Other Ambulatory Visit: Payer: Self-pay | Admitting: *Deleted

## 2015-09-08 MED ORDER — DICLOFENAC SODIUM 1 % TD GEL: 2.0000 g | Freq: Four times a day (QID) | TRANSDERMAL | Status: DC

## 2015-09-28 ENCOUNTER — Ambulatory Visit (INDEPENDENT_AMBULATORY_CARE_PROVIDER_SITE_OTHER): Payer: 59 | Admitting: Family Medicine

## 2015-09-28 ENCOUNTER — Encounter: Payer: Self-pay | Admitting: Family Medicine

## 2015-09-28 VITALS — BP 110/68 | HR 57 | Temp 98.4°F | Ht 70.0 in | Wt 190.0 lb

## 2015-09-28 DIAGNOSIS — L989 Disorder of the skin and subcutaneous tissue, unspecified: Secondary | ICD-10-CM

## 2015-09-29 ENCOUNTER — Encounter: Payer: Self-pay | Admitting: Family Medicine

## 2015-09-29 NOTE — Addendum Note (Signed)
Addended by: Aggie Hacker A on: 09/29/2015 09:18 AM   Modules accepted: Orders

## 2015-09-29 NOTE — Progress Notes (Signed)
   Subjective:    Patient ID: Joe Molina, male    DOB: 03/13/1956, 59 y.o.   MRN: [id number]  HPI Here for me to check a mole on the chest that has been present for years but which he partially tore off last weekend. He had been carrying firewood and this scraped against his chest. He has been wanting this to be removed for some time.    Review of Systems  Constitutional: Negative.   Skin: Positive for wound.       Objective:   Physical Exam  Constitutional: He appears well-developed and well-nourished.  Skin:  There is a 1.4 cm dark brown nodular lesion on the central chest which has been partially avulsed from the underlying skin          Assessment & Plan:  Skin lesion. We agreed that the best thing to do would be to completely excised the lesion. The area was cleansed with Betadine, and LA was achieved using 1% Xylocaine with epinephrine. An eliptical incision was made around the lesion with a scalpel down to the subcutaneous fat layer and the entire lesion was removed. This was sent to Pathology to evaluate. The wound was closed with 5 sutures of 4-0 Ethilon, and was dressed with Neosporin and a Bandaid. He was given instructions for care, and his wife will remove the sutures in 14 days.

## 2015-10-22 ENCOUNTER — Encounter: Payer: Self-pay | Admitting: Internal Medicine

## 2015-10-26 ENCOUNTER — Ambulatory Visit (INDEPENDENT_AMBULATORY_CARE_PROVIDER_SITE_OTHER): Payer: 59 | Admitting: Family Medicine

## 2015-10-26 ENCOUNTER — Encounter: Payer: Self-pay | Admitting: Family Medicine

## 2015-10-26 VITALS — BP 120/72 | HR 53 | Temp 98.4°F | Ht 70.0 in | Wt 190.0 lb

## 2015-10-26 DIAGNOSIS — Z0000 Encounter for general adult medical examination without abnormal findings: Secondary | ICD-10-CM | POA: Diagnosis not present

## 2015-10-26 DIAGNOSIS — Z23 Encounter for immunization: Secondary | ICD-10-CM | POA: Diagnosis not present

## 2015-10-26 LAB — LIPID PANEL
Cholesterol: 223 mg/dL — ABNORMAL HIGH (ref 0–200)
HDL: 53.1 mg/dL (ref >39.00)
LDL CALC: 151 mg/dL — AB (ref 0–99)
NonHDL: 170.23
TRIGLYCERIDES: 96 mg/dL (ref 0.0–149.0)
Total CHOL/HDL Ratio: 4
VLDL: 19.2 mg/dL (ref 0.0–40.0)

## 2015-10-26 LAB — CBC WITH DIFFERENTIAL/PLATELET
BASOS PCT: 0.5 % (ref 0.0–3.0)
Basophils Absolute: 0 10*3/uL (ref 0.0–0.1)
EOS ABS: 0.1 10*3/uL (ref 0.0–0.7)
EOS PCT: 1.6 % (ref 0.0–5.0)
HCT: 47.6 % (ref 39.0–52.0)
HEMOGLOBIN: 16.2 g/dL (ref 13.0–17.0)
LYMPHS PCT: 39.3 % (ref 12.0–46.0)
Lymphs Abs: 2.5 10*3/uL (ref 0.7–4.0)
MCHC: 34 g/dL (ref 30.0–36.0)
MCV: 94.6 fl (ref 78.0–100.0)
Monocytes Absolute: 0.6 10*3/uL (ref 0.1–1.0)
Monocytes Relative: 9.9 % (ref 3.0–12.0)
NEUTROS PCT: 48.7 % (ref 43.0–77.0)
Neutro Abs: 3.1 10*3/uL (ref 1.4–7.7)
Platelets: 202 10*3/uL (ref 150.0–400.0)
RBC: 5.04 Mil/uL (ref 4.22–5.81)
RDW: 12.5 % (ref 11.5–15.5)
WBC: 6.4 10*3/uL (ref 4.0–10.5)

## 2015-10-26 LAB — POCT URINALYSIS DIPSTICK
BILIRUBIN UA: NEGATIVE
Blood, UA: NEGATIVE
GLUCOSE UA: NEGATIVE
Ketones, UA: NEGATIVE
LEUKOCYTES UA: NEGATIVE
NITRITE UA: NEGATIVE
Protein, UA: NEGATIVE
Spec Grav, UA: <=1.005
Urobilinogen, UA: 0.2
pH, UA: 5.5

## 2015-10-26 LAB — BASIC METABOLIC PANEL
BUN: 15 mg/dL (ref 6–23)
CHLORIDE: 101 meq/L (ref 96–112)
CO2: 29 mEq/L (ref 19–32)
Calcium: 9.7 mg/dL (ref 8.4–10.5)
Creatinine, Ser: 0.82 mg/dL (ref 0.40–1.50)
GFR: 101.79 mL/min (ref >60.00)
Glucose, Bld: 90 mg/dL (ref 70–99)
POTASSIUM: 4.2 meq/L (ref 3.5–5.1)
SODIUM: 139 meq/L (ref 135–145)

## 2015-10-26 LAB — HEPATIC FUNCTION PANEL
ALK PHOS: 46 U/L (ref 39–117)
ALT: 23 U/L (ref 0–53)
AST: 30 U/L (ref 0–37)
Albumin: 4.8 g/dL (ref 3.5–5.2)
BILIRUBIN DIRECT: 0.3 mg/dL (ref 0.0–0.3)
Total Bilirubin: 2.4 mg/dL — ABNORMAL HIGH (ref 0.2–1.2)
Total Protein: 6.9 g/dL (ref 6.0–8.3)

## 2015-10-26 LAB — PSA: PSA: 1.54 ng/mL (ref 0.10–4.00)

## 2015-10-26 LAB — TSH: TSH: 2.49 u[IU]/mL (ref 0.35–4.50)

## 2015-10-26 NOTE — Progress Notes (Signed)
   Subjective:    Patient ID: Joe Molina, male    DOB: 13-Aug-1956, 59 y.o.   MRN: [id number]  HPI 43 byr old male for a cpx. He feels well except for several weeks of right elbow pain. This started after he chopped firewood for several hours, and he has also been raking a lot of leaves.    Review of Systems  Constitutional: Negative.   HENT: Negative.   Eyes: Negative.   Respiratory: Negative.   Cardiovascular: Negative.   Gastrointestinal: Negative.   Genitourinary: Negative.   Musculoskeletal: Positive for arthralgias. Negative for myalgias, back pain, joint swelling, gait problem, neck pain and neck stiffness.  Skin: Negative.   Neurological: Negative.   Psychiatric/Behavioral: Negative.        Objective:   Physical Exam  Constitutional: He is oriented to person, place, and time. He appears well-developed and well-nourished. No distress.  HENT:  Head: Normocephalic and atraumatic.  Right Ear: External ear normal.  Left Ear: External ear normal.  Nose: Nose normal.  Mouth/Throat: Oropharynx is clear and moist. No oropharyngeal exudate.  Eyes: Conjunctivae and EOM are normal. Pupils are equal, round, and reactive to light. Right eye exhibits no discharge. Left eye exhibits no discharge. No scleral icterus.  Neck: Neck supple. No JVD present. No tracheal deviation present. No thyromegaly present.  Cardiovascular: Normal rate, regular rhythm, normal heart sounds and intact distal pulses.  Exam reveals no gallop and no friction rub.   No murmur heard. Pulmonary/Chest: Effort normal and breath sounds normal. No respiratory distress. He has no wheezes. He has no rales. He exhibits no tenderness.  Abdominal: Soft. Bowel sounds are normal. He exhibits no distension and no mass. There is no tenderness. There is no rebound and no guarding.  Genitourinary: Rectum normal, prostate normal and penis normal. Guaiac negative stool. No penile tenderness.  Musculoskeletal: Normal range of  motion. He exhibits no edema.  Tender over the right medial epicondyle, no swelling, full ROM   Lymphadenopathy:    He has no cervical adenopathy.  Neurological: He is alert and oriented to person, place, and time. He has normal reflexes. No cranial nerve deficit. He exhibits normal muscle tone. Coordination normal.  Skin: Skin is warm and dry. No rash noted. He is not diaphoretic. No erythema. No pallor.  Psychiatric: He has a normal mood and affect. His behavior is normal. Judgment and thought content normal.          Assessment & Plan:  Well exam. We discussed diet and exercise. He will get fasting labs today. He has medial epicondylitis and I suggested he try rest, Aleve, and ice prn.

## 2015-10-26 NOTE — Progress Notes (Signed)
Pre visit review using our clinic review tool, if applicable. No additional management support is needed unless otherwise documented below in the visit note. 

## 2015-10-26 NOTE — Addendum Note (Signed)
Addended by: Aggie Hacker A on: 10/26/2015 03:55 PM   Modules accepted: Orders

## 2015-10-27 NOTE — Progress Notes (Signed)
   Subjective:    Patient ID: Joe Molina, male    DOB: Nov 15, 1955, 59 y.o.   MRN: [id number]  HPI    Review of Systems     Objective:   Physical Exam  Cardiovascular:  EKG is normal           Assessment & Plan:

## 2016-01-13 MED FILL — LISINOPRIL 10 MG TABLET: 10 | 90 days supply | Qty: 90 | Fill #3

## 2016-02-01 ENCOUNTER — Other Ambulatory Visit: Payer: Self-pay | Admitting: Pulmonary Disease

## 2016-02-01 ENCOUNTER — Other Ambulatory Visit: Payer: 59

## 2016-02-01 DIAGNOSIS — M79669 Pain in unspecified lower leg: Secondary | ICD-10-CM | POA: Diagnosis not present

## 2016-02-01 DIAGNOSIS — M79609 Pain in unspecified limb: Secondary | ICD-10-CM | POA: Diagnosis not present

## 2016-02-02 ENCOUNTER — Encounter: Payer: Self-pay | Admitting: Pulmonary Disease

## 2016-02-02 ENCOUNTER — Ambulatory Visit (INDEPENDENT_AMBULATORY_CARE_PROVIDER_SITE_OTHER): Payer: 59 | Admitting: Pulmonary Disease

## 2016-02-02 VITALS — BP 120/84 | HR 62 | Ht 70.0 in | Wt 193.4 lb

## 2016-02-02 DIAGNOSIS — M79662 Pain in left lower leg: Secondary | ICD-10-CM | POA: Diagnosis not present

## 2016-02-02 LAB — D-DIMER, QUANTITATIVE: D-Dimer, Quant: 0.26 ug/mL-FEU (ref 0.00–0.48)

## 2016-02-02 NOTE — Assessment & Plan Note (Signed)
Negative D dimer is reassuring Check venous duplex of LLE - given calf pain & prior history

## 2016-02-02 NOTE — Progress Notes (Signed)
   Subjective:    Patient ID: Joe Molina, male    DOB: 1956-09-23, 60 y.o.   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 Acute Visit    requested by Dr. Joya Gaskins to be seen today for Calf pain, no swelling, has hx of blood clots.   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 He required TPA and transient IVC filter placement. He was then transitioned to Xarelto and took this for more than a year. 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 He has done well, complains of left-sided calf pain, spontaneous onset, no history of trauma or swelling or fevers  No dyspnea or chest pain  D-dimer was low  Review of Systems Patient denies significant dyspnea,cough, hemoptysis,  chest pain, palpitations, pedal edema, orthopnea, paroxysmal nocturnal dyspnea, lightheadedness, nausea, vomiting, abdominal or  leg pains      Objective:   Physical Exam  Gen. Pleasant, well-nourished, in no distress ENT - no lesions, no post nasal drip Neck: No JVD, no thyromegaly, no carotid bruits Lungs: no use of accessory muscles, no dullness to percussion, clear without rales or rhonchi  Cardiovascular: Rhythm regular, heart sounds  normal, no murmurs or gallops, no peripheral edema Musculoskeletal: No deformities, no cyanosis or clubbing         Assessment & Plan:

## 2016-02-02 NOTE — Patient Instructions (Signed)
Negative D dimer is reassuring Check venous duplex of LLE

## 2016-02-03 ENCOUNTER — Ambulatory Visit (HOSPITAL_COMMUNITY)
Admission: RE | Admit: 2016-02-03 | Discharge: 2016-02-03 | Disposition: A | Discharge status: Home / Self Care | Payer: 59 | Source: Ambulatory Visit | Attending: Cardiovascular Disease | Admitting: Cardiovascular Disease

## 2016-02-03 DIAGNOSIS — M79662 Pain in left lower leg: Secondary | ICD-10-CM | POA: Diagnosis not present

## 2016-02-04 ENCOUNTER — Telehealth: Payer: Self-pay | Admitting: Pulmonary Disease

## 2016-02-04 NOTE — Telephone Encounter (Signed)
Pl let him know - Duplex shows old residual clot - no intervention required Call back if pain persists or if swelling etc

## 2016-02-04 NOTE — Telephone Encounter (Signed)
Left message for patient to call back  

## 2016-02-05 NOTE — Telephone Encounter (Signed)
Patient notified of results. Verbalized understanding, no questions at this time. Nothing further needed.

## 2016-02-09 ENCOUNTER — Encounter (INDEPENDENT_AMBULATORY_CARE_PROVIDER_SITE_OTHER): Payer: 59

## 2016-02-09 DIAGNOSIS — M79662 Pain in left lower leg: Secondary | ICD-10-CM

## 2016-02-25 ENCOUNTER — Telehealth: Payer: 59 | Admitting: Physician Assistant

## 2016-02-25 DIAGNOSIS — B349 Viral infection, unspecified: Secondary | ICD-10-CM

## 2016-02-25 DIAGNOSIS — J329 Chronic sinusitis, unspecified: Secondary | ICD-10-CM

## 2016-02-25 DIAGNOSIS — B9789 Other viral agents as the cause of diseases classified elsewhere: Secondary | ICD-10-CM

## 2016-02-25 MED ORDER — AZELASTINE HCL 0.1 % NA SOLN: 2.0000 | Freq: Two times a day (BID) | NASAL | Status: DC

## 2016-02-25 MED FILL — AZELASTINE HCL 137 MCG SPRY: 0.1 | 25 days supply | Qty: 30 | Fill #0

## 2016-02-25 NOTE — Progress Notes (Signed)

## 2016-02-29 ENCOUNTER — Telehealth: Payer: 59 | Admitting: Family

## 2016-02-29 DIAGNOSIS — J329 Chronic sinusitis, unspecified: Secondary | ICD-10-CM

## 2016-02-29 DIAGNOSIS — A499 Bacterial infection, unspecified: Secondary | ICD-10-CM | POA: Diagnosis not present

## 2016-02-29 DIAGNOSIS — B9689 Other specified bacterial agents as the cause of diseases classified elsewhere: Secondary | ICD-10-CM

## 2016-02-29 MED ORDER — AMOXICILLIN-POT CLAVULANATE 875-125 MG PO TABS: 1.0000 | ORAL_TABLET | Freq: Two times a day (BID) | ORAL | Status: AC

## 2016-02-29 MED FILL — AMOX TR-K CLV 875-125 MG TA: 875-125 | 7 days supply | Qty: 14 | Fill #0

## 2016-02-29 NOTE — Progress Notes (Signed)

## 2016-03-22 DIAGNOSIS — H2513 Age-related nuclear cataract, bilateral: Secondary | ICD-10-CM | POA: Diagnosis not present

## 2016-03-22 DIAGNOSIS — H11001 Unspecified pterygium of right eye: Secondary | ICD-10-CM | POA: Diagnosis not present

## 2016-04-04 DIAGNOSIS — H11051 Peripheral pterygium, progressive, right eye: Secondary | ICD-10-CM | POA: Diagnosis not present

## 2016-04-04 DIAGNOSIS — H01024 Squamous blepharitis left upper eyelid: Secondary | ICD-10-CM | POA: Diagnosis not present

## 2016-04-04 DIAGNOSIS — H01022 Squamous blepharitis right lower eyelid: Secondary | ICD-10-CM | POA: Diagnosis not present

## 2016-04-04 DIAGNOSIS — H01021 Squamous blepharitis right upper eyelid: Secondary | ICD-10-CM | POA: Diagnosis not present

## 2016-04-04 DIAGNOSIS — H2513 Age-related nuclear cataract, bilateral: Secondary | ICD-10-CM | POA: Diagnosis not present

## 2016-04-04 DIAGNOSIS — H01025 Squamous blepharitis left lower eyelid: Secondary | ICD-10-CM | POA: Diagnosis not present

## 2016-04-14 ENCOUNTER — Other Ambulatory Visit: Payer: Self-pay | Admitting: Family Medicine

## 2016-04-14 MED FILL — LISINOPRIL 10 MG TABLET: 10 | 90 days supply | Qty: 90 | Fill #0

## 2016-07-04 ENCOUNTER — Ambulatory Visit (INDEPENDENT_AMBULATORY_CARE_PROVIDER_SITE_OTHER): Payer: 59 | Admitting: Sports Medicine

## 2016-07-04 ENCOUNTER — Encounter: Payer: Self-pay | Admitting: Sports Medicine

## 2016-07-04 VITALS — BP 117/83 | HR 52 | Ht 70.0 in | Wt 190.0 lb

## 2016-07-04 DIAGNOSIS — M25561 Pain in right knee: Secondary | ICD-10-CM | POA: Diagnosis not present

## 2016-07-04 NOTE — Patient Instructions (Addendum)
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  I want you to start doing your isometric straight leg raises daily. Don't forget to turn your foot out while doing them. Also do the half squats as demonstrated on the sheet.  I also think you would do well with a body helix patellar strap.  If you need to take some Aleve, then I would recommend taking 1 pill twice a day with food for 4-5 days. If you experience any stomach upset then you must stop the medication immediately.  If your pain does not improve, I would start with getting some x-rays of your knee because your ultrasound looks really good.

## 2016-07-05 NOTE — Progress Notes (Signed)
   Subjective:    Patient ID: Joe Molina, male    DOB: January 18, 1956, 60 y.o.   MRN: [id number]  HPI chief complaint: Right knee pain  Jhalil presents today with intermittent anterior right knee pain which was made worse by a long bike ride 2 weeks ago. He states that he has had intermittent catching and pain deep to the patella for several years. It is most noticeable with leg extensions in the gym as well as with climbing up and down stairs or up and down on his tractor. 2 weeks ago he went for a very long bike ride which exacerbated his symptoms.This is not an ordinary exercise for him. He denies any swelling. No locking, catching, or popping. No feelings of instability. He does describe weakness in his knees with that long bike ride. No recent or remote trauma. No prior right knee surgery. No associated numbness or tingling. He does note that pressure across the patellar tendon, such as with using the hamstring curl machine at the gym, improves his pain. He is not currently taking any pain medicine. He has not had any imaging.  Interim medical history reviewed Medications reviewed Allergies reviewed    Review of Systems    as above Objective:   Physical Exam  Well-developed, well-nourished. No acute distress. Vital signs reviewed  Right knee: Full range of motion. No effusion. There is no tenderness to palpation along the patellar tendon. No soft tissue swelling. Good patellar mobility. Mild VMO weakness. No tenderness to palpation along the distal IT band. No tenderness along the medial or lateral joint lines. Negative Thessaly's. Good ligamentous stability. Good alignment. Neurovascularly intact distally. Walking without a limp.  [facility] ultrasound of the right knee was performed. Suprapatellar pouch shows no obvious effusion. Patellar tendon is unremarkable. Visualized portions of the medial and lateral menisci were also unremarkable.      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 I want him to do isometric exercises and avoid leg extensions in the gym. He can continue with hamstring strengthening and all other activities as tolerated. I think he would also benefit from a body helix patellar strap when active. He can also use some over-the-counter Aleve for the next 4-5 days if needed. If symptoms persist or worsen I would consider x-rays of the right knee as well as a possible intra-articular cortisone injection. Patient will follow-up for ongoing or recalcitrant issues.

## 2016-07-13 MED FILL — LISINOPRIL 10 MG TABLET: 10 | 90 days supply | Qty: 90 | Fill #1

## 2016-08-11 ENCOUNTER — Other Ambulatory Visit: Payer: Self-pay | Admitting: *Deleted

## 2016-08-11 DIAGNOSIS — M25561 Pain in right knee: Secondary | ICD-10-CM

## 2016-08-12 ENCOUNTER — Ambulatory Visit (HOSPITAL_COMMUNITY)
Admission: RE | Admit: 2016-08-12 | Discharge: 2016-08-12 | Disposition: A | Discharge status: Home / Self Care | Payer: 59 | Source: Ambulatory Visit | Attending: Sports Medicine | Admitting: Sports Medicine

## 2016-08-12 DIAGNOSIS — M25561 Pain in right knee: Secondary | ICD-10-CM | POA: Diagnosis not present

## 2016-08-15 ENCOUNTER — Encounter: Payer: Self-pay | Admitting: Sports Medicine

## 2016-08-15 ENCOUNTER — Ambulatory Visit (INDEPENDENT_AMBULATORY_CARE_PROVIDER_SITE_OTHER): Payer: 59 | Admitting: Sports Medicine

## 2016-08-15 VITALS — BP 120/70 | Ht 70.0 in | Wt 190.0 lb

## 2016-08-15 DIAGNOSIS — M25561 Pain in right knee: Secondary | ICD-10-CM

## 2016-08-15 MED ORDER — METHYLPREDNISOLONE ACETATE 40 MG/ML IJ SUSP
20.0000 mg | Freq: Once | INTRAMUSCULAR | Status: AC
  Administered 2016-08-15: 20 mg via INTRA_ARTICULAR

## 2016-08-15 NOTE — Progress Notes (Signed)
  Patient comes in today for a cortisone injection into his right knee. Recent x-rays show a paucity of degenerative changes. He does have some mild medial joint space narrowing. He is still having knee pain that is difficult to localize. I've recommended that we try a cortisone injection prior to further diagnostic imaging. Patient agrees. Patient's knee was injected with an anterior lateral approach. He tolerated this without difficulty. He will continue with his home exercise program and will try to resume activity as tolerated. If symptoms persist despite today's injection then I would consider merits of an MRI scan specifically to rule out a degenerative meniscal tear.

## 2016-09-07 DIAGNOSIS — H11001 Unspecified pterygium of right eye: Secondary | ICD-10-CM | POA: Diagnosis not present

## 2016-09-26 ENCOUNTER — Encounter: Payer: Self-pay | Admitting: Family Medicine

## 2016-10-10 MED FILL — LISINOPRIL 10 MG TABLET: 10 | 90 days supply | Qty: 90 | Fill #2

## 2016-10-11 ENCOUNTER — Encounter: Payer: Self-pay | Admitting: Family Medicine

## 2016-10-13 NOTE — Telephone Encounter (Signed)
He may have a better experience seeing an Audiologist who is affiliated with an ENT office, for example [facility]

## 2017-01-17 ENCOUNTER — Other Ambulatory Visit: Payer: Self-pay | Admitting: *Deleted

## 2017-01-17 ENCOUNTER — Telehealth: Payer: Self-pay | Admitting: Sports Medicine

## 2017-01-17 DIAGNOSIS — G8929 Other chronic pain: Secondary | ICD-10-CM | POA: Diagnosis not present

## 2017-01-17 DIAGNOSIS — M25561 Pain in right knee: Secondary | ICD-10-CM

## 2017-01-17 DIAGNOSIS — M7712 Lateral epicondylitis, left elbow: Secondary | ICD-10-CM | POA: Diagnosis not present

## 2017-01-17 NOTE — Telephone Encounter (Signed)
  I spoke with Joe Molina on the phone today about his right knee. He was last seen in the office back in October and a cortisone injection did not provide him with any symptom relief. His previous x-rays showed only some mild medial compartmental DJD. At this point I recommend that we evaluate this further with an MRI before deciding on further treatment. He will follow-up with me in the office after that study to discuss the results and delineate a more definitive treatment plan. We will also plan on doing a left elbow ultrasound at that visit. He injured the elbow initially last summer and re-injured the elbow in the fall. X-rays done at a local urgent care were unremarkable per his report. It sounds like he may be dealing with lateral epicondylitis so we will consider an ultrasound evaluation of the common extensor tendon at his follow-up visit.

## 2017-01-20 ENCOUNTER — Other Ambulatory Visit: Payer: Self-pay | Admitting: Family Medicine

## 2017-01-20 MED FILL — LISINOPRIL 10 MG TABLET: 10 | 90 days supply | Qty: 90 | Fill #0

## 2017-01-25 ENCOUNTER — Ambulatory Visit (HOSPITAL_COMMUNITY): Payer: 59

## 2017-01-26 DIAGNOSIS — M7712 Lateral epicondylitis, left elbow: Secondary | ICD-10-CM | POA: Diagnosis not present

## 2017-01-26 DIAGNOSIS — M25561 Pain in right knee: Secondary | ICD-10-CM | POA: Diagnosis not present

## 2017-01-26 DIAGNOSIS — G8929 Other chronic pain: Secondary | ICD-10-CM | POA: Diagnosis not present

## 2017-01-27 ENCOUNTER — Other Ambulatory Visit (INDEPENDENT_AMBULATORY_CARE_PROVIDER_SITE_OTHER): Payer: 59

## 2017-01-27 DIAGNOSIS — Z0000 Encounter for general adult medical examination without abnormal findings: Secondary | ICD-10-CM

## 2017-01-27 LAB — HEPATIC FUNCTION PANEL
ALBUMIN: 4.5 g/dL (ref 3.5–5.2)
ALT: 19 U/L (ref 0–53)
AST: 24 U/L (ref 0–37)
Alkaline Phosphatase: 49 U/L (ref 39–117)
Bilirubin, Direct: 0.3 mg/dL (ref 0.0–0.3)
Total Bilirubin: 1.9 mg/dL — ABNORMAL HIGH (ref 0.2–1.2)
Total Protein: 6.5 g/dL (ref 6.0–8.3)

## 2017-01-27 LAB — CBC WITH DIFFERENTIAL/PLATELET
BASOS PCT: 1 % (ref 0.0–3.0)
Basophils Absolute: 0 10*3/uL (ref 0.0–0.1)
EOS PCT: 3 % (ref 0.0–5.0)
Eosinophils Absolute: 0.1 10*3/uL (ref 0.0–0.7)
HCT: 45 % (ref 39.0–52.0)
HEMOGLOBIN: 15.9 g/dL (ref 13.0–17.0)
LYMPHS ABS: 1.2 10*3/uL (ref 0.7–4.0)
Lymphocytes Relative: 31.5 % (ref 12.0–46.0)
MCHC: 35.3 g/dL (ref 30.0–36.0)
MCV: 95.9 fl (ref 78.0–100.0)
MONO ABS: 0.4 10*3/uL (ref 0.1–1.0)
Monocytes Relative: 12 % (ref 3.0–12.0)
NEUTROS PCT: 52.5 % (ref 43.0–77.0)
Neutro Abs: 1.9 10*3/uL (ref 1.4–7.7)
Platelets: 197 10*3/uL (ref 150.0–400.0)
RBC: 4.69 Mil/uL (ref 4.22–5.81)
RDW: 12.1 % (ref 11.5–15.5)
WBC: 3.7 10*3/uL — ABNORMAL LOW (ref 4.0–10.5)

## 2017-01-27 LAB — LIPID PANEL
CHOLESTEROL: 185 mg/dL (ref 0–200)
HDL: 48 mg/dL (ref >39.00)
LDL CALC: 120 mg/dL — AB (ref 0–99)
NonHDL: 137.08
TRIGLYCERIDES: 85 mg/dL (ref 0.0–149.0)
Total CHOL/HDL Ratio: 4
VLDL: 17 mg/dL (ref 0.0–40.0)

## 2017-01-27 LAB — BASIC METABOLIC PANEL
BUN: 14 mg/dL (ref 6–23)
CHLORIDE: 100 meq/L (ref 96–112)
CO2: 28 mEq/L (ref 19–32)
Calcium: 9.9 mg/dL (ref 8.4–10.5)
Creatinine, Ser: 0.92 mg/dL (ref 0.40–1.50)
GFR: 88.76 mL/min (ref >60.00)
GLUCOSE: 102 mg/dL — AB (ref 70–99)
POTASSIUM: 4.3 meq/L (ref 3.5–5.1)
Sodium: 135 mEq/L (ref 135–145)

## 2017-01-27 LAB — POC URINALSYSI DIPSTICK (AUTOMATED)
BILIRUBIN UA: NEGATIVE
Blood, UA: NEGATIVE
Glucose, UA: NEGATIVE
Ketones, UA: NEGATIVE
LEUKOCYTES UA: NEGATIVE
NITRITE UA: NEGATIVE
PH UA: 6.5 (ref 5.0–8.0)
Spec Grav, UA: 1.02 (ref 1.030–1.035)
UROBILINOGEN UA: 0.2 (ref ?–2.0)

## 2017-01-27 LAB — PSA: PSA: 1.47 ng/mL (ref 0.10–4.00)

## 2017-01-27 LAB — TSH: TSH: 2.02 u[IU]/mL (ref 0.35–4.50)

## 2017-01-30 ENCOUNTER — Ambulatory Visit (HOSPITAL_COMMUNITY): Payer: 59

## 2017-02-03 DIAGNOSIS — M7712 Lateral epicondylitis, left elbow: Secondary | ICD-10-CM | POA: Diagnosis not present

## 2017-02-07 ENCOUNTER — Ambulatory Visit (INDEPENDENT_AMBULATORY_CARE_PROVIDER_SITE_OTHER): Payer: 59 | Admitting: Family Medicine

## 2017-02-07 ENCOUNTER — Encounter: Payer: Self-pay | Admitting: Family Medicine

## 2017-02-07 VITALS — BP 128/82 | HR 54 | Temp 98.2°F | Ht 70.0 in | Wt 191.0 lb

## 2017-02-07 DIAGNOSIS — I10 Essential (primary) hypertension: Secondary | ICD-10-CM

## 2017-02-07 NOTE — Progress Notes (Signed)
   Subjective:    Patient ID: FINDLEY VI, male    DOB: Mar 10, 1956, 61 y.o.   MRN: [id number]  HPI He had to leave before we could see him.    Review of Systems     Objective:   Physical Exam        Assessment & Plan:

## 2017-02-07 NOTE — Progress Notes (Signed)
Pre visit review using our clinic review tool, if applicable. No additional management support is needed unless otherwise documented below in the visit note. 

## 2017-02-07 NOTE — Patient Instructions (Signed)
WE NOW OFFER   [facility] FAST TRACK!!!  SAME DAY Appointments for ACUTE CARE  Such as: Sprains, Injuries, cuts, abrasions, rashes, muscle pain, joint pain, back pain Colds, flu, sore throats, headache, allergies, cough, fever  Ear pain, sinus and eye infections Abdominal pain, nausea, vomiting, diarrhea, upset stomach Animal/insect bites  3 Easy Ways to Schedule: Walk-In Scheduling Call in scheduling Mychart Sign-up: [email or web address]         

## 2017-02-15 DIAGNOSIS — M1711 Unilateral primary osteoarthritis, right knee: Secondary | ICD-10-CM | POA: Diagnosis not present

## 2017-02-15 DIAGNOSIS — S83241A Other tear of medial meniscus, current injury, right knee, initial encounter: Secondary | ICD-10-CM | POA: Diagnosis not present

## 2017-03-14 DIAGNOSIS — S83241D Other tear of medial meniscus, current injury, right knee, subsequent encounter: Secondary | ICD-10-CM | POA: Diagnosis not present

## 2017-04-06 MED FILL — LISINOPRIL 10 MG TABLET: 10 | 90 days supply | Qty: 90 | Fill #1

## 2017-07-12 ENCOUNTER — Other Ambulatory Visit: Payer: Self-pay | Admitting: Family Medicine

## 2017-07-12 MED FILL — LISINOPRIL 10 MG TABLET: 10 | 90 days supply | Qty: 90 | Fill #0

## 2017-07-12 NOTE — Telephone Encounter (Signed)
Can we refill this? 

## 2017-08-03 ENCOUNTER — Encounter: Payer: Self-pay | Admitting: Family Medicine

## 2017-10-09 MED FILL — LISINOPRIL 10 MG TABS: 10 | 90 days supply | Qty: 90 | Fill #1

## 2018-01-01 MED FILL — LISINOPRIL 10 MG TABS: 10 | 90 days supply | Qty: 90 | Fill #2 | Status: TO

## 2018-02-05 ENCOUNTER — Encounter: Payer: Self-pay | Admitting: Family Medicine

## 2018-02-07 ENCOUNTER — Ambulatory Visit (INDEPENDENT_AMBULATORY_CARE_PROVIDER_SITE_OTHER): Payer: 59 | Admitting: Family Medicine

## 2018-02-07 ENCOUNTER — Encounter: Payer: Self-pay | Admitting: Family Medicine

## 2018-02-07 VITALS — BP 130/76 | HR 54 | Temp 98.0°F | Resp 16 | Ht 70.0 in | Wt 189.0 lb

## 2018-02-07 DIAGNOSIS — I2699 Other pulmonary embolism without acute cor pulmonale: Secondary | ICD-10-CM | POA: Diagnosis not present

## 2018-02-07 DIAGNOSIS — Z125 Encounter for screening for malignant neoplasm of prostate: Secondary | ICD-10-CM | POA: Diagnosis not present

## 2018-02-07 DIAGNOSIS — K219 Gastro-esophageal reflux disease without esophagitis: Secondary | ICD-10-CM

## 2018-02-07 DIAGNOSIS — I10 Essential (primary) hypertension: Secondary | ICD-10-CM

## 2018-02-07 DIAGNOSIS — D6851 Activated protein C resistance: Secondary | ICD-10-CM

## 2018-02-07 DIAGNOSIS — E7800 Pure hypercholesterolemia, unspecified: Secondary | ICD-10-CM | POA: Diagnosis not present

## 2018-02-07 NOTE — Assessment & Plan Note (Signed)
Well-controlled with lifestyle modifications and without medication Continue to monitor

## 2018-02-07 NOTE — Progress Notes (Signed)
Patient: Joe Molina, Male    DOB: 1955/11/28, 62 y.o.   MRN: [id number] Visit Date: 02/07/2018  Today's Provider: Lavon Paganini, MD   I, Joe Molina, CMA, am acting as scribe for Lavon Paganini, MD.  Chief Complaint  Patient presents with  . Establish Care   Subjective:    Establish Care Joe Molina is a 62 y.o. male who presents today to establish care. He feels well. He reports exercising 3 days per week for an hour. Cardio and strength training. He reports he is sleeping well.  His PMH includes PE (treated by Dr Joya Gaskins with Xarelto and then transitioned to 2 aspirin daily), HTN. He is taking lisinopril 10mg  daily for BP, and would like to discuss D/C this medication. Has been taking for ~8 years.  States he was able to get off of it with exercise in the past.  He thinks that now that he is retired, his BP might be lower.  He is also c/o arthralgias.   GERD: asymptomatic for years after raising head of bed.  Stopped PPI.  Doesn't eat anything after 7pm and avoids tomato based foods.  Last colonoscopy- 06/04/2014- polypoid colonic mucosa, moderate diverticulosis. Repeat in 2020. -----------------------------------------------------------------   Review of Systems  Constitutional: Negative.   HENT: Positive for tinnitus. Negative for congestion, dental problem, drooling, ear discharge, ear pain, facial swelling, hearing loss, mouth sores, nosebleeds, postnasal drip, rhinorrhea, sinus pressure, sinus pain, sneezing, sore throat, trouble swallowing and voice change.   Eyes: Negative.   Respiratory: Negative.   Cardiovascular: Negative.   Gastrointestinal: Negative.   Endocrine: Negative.   Genitourinary: Negative.   Musculoskeletal: Negative.   Skin: Negative.   Allergic/Immunologic: Negative.   Neurological: Negative.   Hematological: Negative.   Psychiatric/Behavioral: Negative.     Social History      He  reports that he has never smoked. He has never  used smokeless tobacco. He reports that he drinks about 8.4 oz of alcohol per week. He reports that he does not use drugs.       Social History   Socioeconomic History  . Marital status: Married    Spouse name: Corporate treasurer  . Number of children: 2  . Years of education: 47  . Highest education level: Doctorate  Occupational History  . Occupation: Retired  Scientific laboratory technician  . Financial resource strain: Not hard at all  . Food insecurity:    Worry: Never true    Inability: Never true  . Transportation needs:    Medical: No    Non-medical: No  Tobacco Use  . Smoking status: Never Smoker  . Smokeless tobacco: Never Used  Substance and Sexual Activity  . Alcohol use: Yes    Alcohol/week: 8.4 oz    Types: 14 Standard drinks or equivalent per week    Comment: 2 liquor drinks daily  . Drug use: No  . Sexual activity: Not on file  Lifestyle  . Physical activity:    Days per week: 3 days    Minutes per session: 60 min  . Stress: Not on file  Relationships  . Social connections:    Talks on phone: Not on file    Gets together: Not on file    Attends religious service: Not on file    Active member of club or organization: Not on file    Attends meetings of clubs or organizations: Not on file    Relationship status: Not on file  Other Topics  Concern  . Not on file  Social History Narrative  . Not on file    Past Medical History:  Diagnosis Date  . Clotting disorder ([place])   . Factor V Leiden mutation ([place])   . GERD (gastroesophageal reflux disease)   . Hx of pulmonary embolus 11/01/2013  . Hyperlipidemia   . Hypertension   . Personal history of colonic adenomas 10/19/2010     Patient Active Problem List   Diagnosis Date Noted  . Chronic left hip pain 06/25/2015  . Abdominal muscle pain 11/27/2014  . Anemia in other chronic diseases classified elsewhere 04/24/2014  . Factor V Leiden mutation ([place]) 11/28/2013  . DVT (deep venous thrombosis) ([place]) 11/04/2013  . Subacute massive  pulmonary embolism ([place]) 11/03/2013  . HEEL PAIN 11/17/2010  . Personal history of colonic adenomas 10/19/2010  . ANXIETY STATE, UNSPECIFIED 08/20/2010  . NUMBNESS 03/03/2009  . HYPERLIPIDEMIA 08/11/2008  . GERD 08/11/2008  . TENOSYNOVITIS OF FOOT AND ANKLE 10/15/2007  . BURSITIS 10/15/2007  . Essential hypertension 06/25/2007    Past Surgical History:  Procedure Laterality Date  . CHOLECYSTECTOMY  06//06   Dr. Deon Pilling  . COLONOSCOPY  06-04-14   per Dr. Carlean Purl, benign polyps, repeat in 5 yrs (hx of adenomas)   . [place] History        Family Status  Relation Name Status  . Unknown  (Not Specified)  . Mother  Alive  . Father  Alive  . Sister  Alive  . Brother  Alive  . Neg Hx  (Not Specified)        His family history includes Anemia in his brother and sister; Clotting disorder in his mother; Heart attack in his father; Heart disease in his mother and unknown relative; Mental illness in his brother; Prostate cancer in his father. There is no history of Colon cancer.      Allergies  Allergen Reactions  . Codeine Other (See Comments)  . Codeine Sulfate     REACTION: nausea     Current Outpatient Medications:  .  aspirin 81 MG tablet, Take 162 mg by mouth daily., Disp: , Rfl:  .  cholecalciferol (VITAMIN D) 1000 UNITS tablet, Take 1,000 Units by mouth daily., Disp: , Rfl:  .  FIBER PO, Take by mouth., Disp: , Rfl:  .  Glucosamine HCl (GLUCOSAMINE PO), Take by mouth., Disp: , Rfl:  .  lisinopril (PRINIVIL,ZESTRIL) 10 MG tablet, TAKE 1 TABLET BY MOUTH ONCE DAILY, Disp: 90 tablet, Rfl: 3 .  Multiple Vitamin (MULTIVITAMIN) tablet, Take 1 tablet by mouth daily. 50 plus, Disp: , Rfl:  .  Omega-3 Fatty Acids (FISH OIL PO), Take 1 capsule by mouth daily. , Disp: , Rfl:    Patient Care Team: Virginia Crews, MD as PCP - General (Family Medicine) Heath Lark, MD as Consulting Physician (Hematology and Oncology) Elsie Stain, MD as Consulting  Physician (Pulmonary Disease)      Objective:   Vitals: BP 130/76 (BP Location: Left Arm, Patient Position: Sitting, Cuff Size: Large)   Pulse (!) 54   Temp 98 F (36.7 C) (Oral)   Resp 16   Ht 5\' 10"  (1.778 m)   Wt 189 lb (85.7 kg)   SpO2 98%   BMI 27.12 kg/m    Vitals:   02/07/18 0841  BP: 130/76  Pulse: (!) 54  Resp: 16  Temp: 98 F (36.7 C)  TempSrc: Oral  SpO2: 98%  Weight: 189 lb (  85.7 kg)  Height: 5\' 10"  (1.778 m)     Physical Exam  Constitutional: He is oriented to person, place, and time. He appears well-developed and well-nourished. No distress.  HENT:  Head: Normocephalic and atraumatic.  Right Ear: External ear normal.  Left Ear: External ear normal.  Nose: Nose normal.  Mouth/Throat: Oropharynx is clear and moist.  Eyes: Pupils are equal, round, and reactive to light. Conjunctivae and EOM are normal. No scleral icterus.  Neck: Neck supple. No thyromegaly present.  Cardiovascular: Normal rate, regular rhythm, normal heart sounds and intact distal pulses.  No murmur heard. Pulmonary/Chest: Effort normal and breath sounds normal. No respiratory distress. He has no wheezes. He has no rales.  Abdominal: Soft. Bowel sounds are normal. He exhibits no distension. There is no tenderness. There is no rebound and no guarding.  Musculoskeletal: He exhibits no edema or deformity.  Lymphadenopathy:    He has no cervical adenopathy.  Neurological: He is alert and oriented to person, place, and time.  Skin: Skin is warm and dry. No rash noted.  Psychiatric: He has a normal mood and affect. His behavior is normal.  Vitals reviewed.    Depression Screen PHQ 2/9 Scores 02/07/2017 07/04/2016  PHQ - 2 Score 0 0     Assessment & Plan:    Problem List Items Addressed This Visit      Cardiovascular and Mediastinum   Essential hypertension - Primary    Currently well controlled With decreased stress and increased exercise, willing to try.  Off of medication We  will hold lisinopril 10 mg for the next 3 months Patient to monitor home blood pressures during this time Discussed goal of blood pressure less than 140/90 and that Sprint trial showed that tighter control improves long-term outcomes Discussed importance of regular exercise and DASH diet      Relevant Orders   TSH   Subacute massive pulmonary embolism (HCC)    History of massive PE with large clot burden in left lower extremity common femoral vein 11/01/13 Status post TPA and IVC filter Status post 1 year of treatment with Xarelto Hypercoagulable panel showed factor V Leiden mutation as the only abnormality Patient currently taking aspirin 162 mg daily which she will continue indefinitely Per previous hematology recommendations, patient will need to take full dose aspirin if he is traveling and prophylactic anticoagulation therapy in the event of surgery        Digestive   GERD (gastroesophageal reflux disease)    Well-controlled with lifestyle modifications and without medication Continue to monitor      Relevant Medications   FIBER PO     Hematopoietic and Hemostatic   Factor V Leiden mutation ([place])    Treatment as above for history of pulmonary embolism      Relevant Orders   CBC w/Diff/Platelet     Other   Hyperlipidemia    Previously with mildly elevated LDL Not taking any medications Recheck lipid panel and calculate ASCVD risk will jointly make decision about statin therapy pending ASCVD risk      Relevant Orders   Lipid panel   Comprehensive metabolic panel   TSH    Other Visit Diagnoses    Screening PSA (prostate specific antigen)       Relevant Orders   PSA Total (Reflex To Free)       Return in about 3 months (around 05/10/2018) for BP f/u.   The entirety of the information documented in the History of Present Illness,  Review of Systems and Physical Exam were personally obtained by me. Portions of this information were initially documented by Raquel Sarna  Ratchord, CMA and reviewed by me for thoroughness and accuracy.    Virginia Crews, MD, MPH [facility] 02/07/2018 9:47 AM

## 2018-02-07 NOTE — Assessment & Plan Note (Signed)
Currently well controlled With decreased stress and increased exercise, willing to try.  Off of medication We will hold lisinopril 10 mg for the next 3 months Patient to monitor home blood pressures during this time Discussed goal of blood pressure less than 140/90 and that Sprint trial showed that tighter control improves long-term outcomes Discussed importance of regular exercise and DASH diet

## 2018-02-07 NOTE — Assessment & Plan Note (Signed)
Treatment as above for history of pulmonary embolism

## 2018-02-07 NOTE — Assessment & Plan Note (Signed)
History of massive PE with large clot burden in left lower extremity common femoral vein 11/01/13 Status post TPA and IVC filter Status post 1 year of treatment with Xarelto Hypercoagulable panel showed factor V Leiden mutation as the only abnormality Patient currently taking aspirin 162 mg daily which she will continue indefinitely Per previous hematology recommendations, patient will need to take full dose aspirin if he is traveling and prophylactic anticoagulation therapy in the event of surgery

## 2018-02-07 NOTE — Patient Instructions (Signed)
DASH Eating Plan DASH stands for "Dietary Approaches to Stop Hypertension." The DASH eating plan is a healthy eating plan that has been shown to reduce high blood pressure (hypertension). It may also reduce your risk for type 2 diabetes, heart disease, and stroke. The DASH eating plan may also help with weight loss. What are tips for following this plan? General guidelines  Avoid eating more than 2,300 mg (milligrams) of salt (sodium) a day. If you have hypertension, you may need to reduce your sodium intake to 1,500 mg a day.  Limit alcohol intake to no more than 1 drink a day for nonpregnant women and 2 drinks a day for men. One drink equals 12 oz of beer, 5 oz of wine, or 1 oz of hard liquor.  Work with your health care provider to maintain a healthy body weight or to lose weight. Ask what an ideal weight is for you.  Get at least 30 minutes of exercise that causes your heart to beat faster (aerobic exercise) most days of the week. Activities may include walking, swimming, or biking.  Work with your health care provider or diet and nutrition specialist (dietitian) to adjust your eating plan to your individual calorie needs. Reading food labels  Check food labels for the amount of sodium per serving. Choose foods with less than 5 percent of the Daily Value of sodium. Generally, foods with less than 300 mg of sodium per serving fit into this eating plan.  To find whole grains, look for the word "whole" as the first word in the ingredient list. Shopping  Buy products labeled as "low-sodium" or "no salt added."  Buy fresh foods. Avoid canned foods and premade or frozen meals. Cooking  Avoid adding salt when cooking. Use salt-free seasonings or herbs instead of table salt or sea salt. Check with your health care provider or pharmacist before using salt substitutes.  Do not fry foods. Cook foods using healthy methods such as baking, boiling, grilling, and broiling instead.  Cook with  heart-healthy oils, such as olive, canola, soybean, or sunflower oil. Meal planning   Eat a balanced diet that includes: ? 5 or more servings of fruits and vegetables each day. At each meal, try to fill half of your plate with fruits and vegetables. ? Up to 6-8 servings of whole grains each day. ? Less than 6 oz of lean meat, poultry, or fish each day. A 3-oz serving of meat is about the same size as a deck of cards. One egg equals 1 oz. ? 2 servings of low-fat dairy each day. ? A serving of nuts, seeds, or beans 5 times each week. ? Heart-healthy fats. Healthy fats called Omega-3 fatty acids are found in foods such as flaxseeds and coldwater fish, like sardines, salmon, and mackerel.  Limit how much you eat of the following: ? Canned or prepackaged foods. ? Food that is high in trans fat, such as fried foods. ? Food that is high in saturated fat, such as fatty meat. ? Sweets, desserts, sugary drinks, and other foods with added sugar. ? Full-fat dairy products.  Do not salt foods before eating.  Try to eat at least 2 vegetarian meals each week.  Eat more home-cooked food and less restaurant, buffet, and fast food.  When eating at a restaurant, ask that your food be prepared with less salt or no salt, if possible. What foods are recommended? The items listed may not be a complete list. Talk with your dietitian about what   dietary choices are best for you. Grains Whole-grain or whole-wheat bread. Whole-grain or whole-wheat pasta. Brown rice. Oatmeal. Quinoa. Bulgur. Whole-grain and low-sodium cereals. Pita bread. Low-fat, low-sodium crackers. Whole-wheat flour tortillas. Vegetables Fresh or frozen vegetables (raw, steamed, roasted, or grilled). Low-sodium or reduced-sodium tomato and vegetable juice. Low-sodium or reduced-sodium tomato sauce and tomato paste. Low-sodium or reduced-sodium canned vegetables. Fruits All fresh, dried, or frozen fruit. Canned fruit in natural juice (without  added sugar). Meat and other protein foods Skinless chicken or turkey. Ground chicken or turkey. Pork with fat trimmed off. Fish and seafood. Egg whites. Dried beans, peas, or lentils. Unsalted nuts, nut butters, and seeds. Unsalted canned beans. Lean cuts of beef with fat trimmed off. Low-sodium, lean deli meat. Dairy Low-fat (1%) or fat-free (skim) milk. Fat-free, low-fat, or reduced-fat cheeses. Nonfat, low-sodium ricotta or cottage cheese. Low-fat or nonfat yogurt. Low-fat, low-sodium cheese. Fats and oils Soft margarine without trans fats. Vegetable oil. Low-fat, reduced-fat, or light mayonnaise and salad dressings (reduced-sodium). Canola, safflower, olive, soybean, and sunflower oils. Avocado. Seasoning and other foods Herbs. Spices. Seasoning mixes without salt. Unsalted popcorn and pretzels. Fat-free sweets. What foods are not recommended? The items listed may not be a complete list. Talk with your dietitian about what dietary choices are best for you. Grains Baked goods made with fat, such as croissants, muffins, or some breads. Dry pasta or rice meal packs. Vegetables Creamed or fried vegetables. Vegetables in a cheese sauce. Regular canned vegetables (not low-sodium or reduced-sodium). Regular canned tomato sauce and paste (not low-sodium or reduced-sodium). Regular tomato and vegetable juice (not low-sodium or reduced-sodium). Pickles. Olives. Fruits Canned fruit in a light or heavy syrup. Fried fruit. Fruit in cream or butter sauce. Meat and other protein foods Fatty cuts of meat. Ribs. Fried meat. Bacon. Sausage. Bologna and other processed lunch meats. Salami. Fatback. Hotdogs. Bratwurst. Salted nuts and seeds. Canned beans with added salt. Canned or smoked fish. Whole eggs or egg yolks. Chicken or turkey with skin. Dairy Whole or 2% milk, cream, and half-and-half. Whole or full-fat cream cheese. Whole-fat or sweetened yogurt. Full-fat cheese. Nondairy creamers. Whipped toppings.  Processed cheese and cheese spreads. Fats and oils Butter. Stick margarine. Lard. Shortening. Ghee. Bacon fat. Tropical oils, such as coconut, palm kernel, or palm oil. Seasoning and other foods Salted popcorn and pretzels. Onion salt, garlic salt, seasoned salt, table salt, and sea salt. Worcestershire sauce. Tartar sauce. Barbecue sauce. Teriyaki sauce. Soy sauce, including reduced-sodium. Steak sauce. Canned and packaged gravies. Fish sauce. Oyster sauce. Cocktail sauce. Horseradish that you find on the shelf. Ketchup. Mustard. Meat flavorings and tenderizers. Bouillon cubes. Hot sauce and Tabasco sauce. Premade or packaged marinades. Premade or packaged taco seasonings. Relishes. Regular salad dressings. Where to find more information:  National Heart, Lung, and Blood Institute: www.nhlbi.nih.gov  American Heart Association: www.heart.org Summary  The DASH eating plan is a healthy eating plan that has been shown to reduce high blood pressure (hypertension). It may also reduce your risk for type 2 diabetes, heart disease, and stroke.  With the DASH eating plan, you should limit salt (sodium) intake to 2,300 mg a day. If you have hypertension, you may need to reduce your sodium intake to 1,500 mg a day.  When on the DASH eating plan, aim to eat more fresh fruits and vegetables, whole grains, lean proteins, low-fat dairy, and heart-healthy fats.  Work with your health care provider or diet and nutrition specialist (dietitian) to adjust your eating plan to your individual   calorie needs. This information is not intended to replace advice given to you by your health care provider. Make sure you discuss any questions you have with your health care provider. Document Released: 10/20/2011 Document Revised: 10/24/2016 Document Reviewed: 10/24/2016 Elsevier Interactive Patient Education  2018 Elsevier Inc.  

## 2018-02-07 NOTE — Assessment & Plan Note (Signed)
Previously with mildly elevated LDL Not taking any medications Recheck lipid panel and calculate ASCVD risk will jointly make decision about statin therapy pending ASCVD risk

## 2018-02-08 ENCOUNTER — Telehealth: Payer: Self-pay

## 2018-02-08 LAB — CBC WITH DIFFERENTIAL/PLATELET
BASOS: 0 %
Basophils Absolute: 0 10*3/uL (ref 0.0–0.2)
EOS (ABSOLUTE): 0.1 10*3/uL (ref 0.0–0.4)
Eos: 2 %
Hematocrit: 43.7 % (ref 37.5–51.0)
Hemoglobin: 14.9 g/dL (ref 13.0–17.7)
IMMATURE GRANS (ABS): 0 10*3/uL (ref 0.0–0.1)
Immature Granulocytes: 0 %
Lymphocytes Absolute: 1.6 10*3/uL (ref 0.7–3.1)
Lymphs: 33 %
MCH: 32.7 pg (ref 26.6–33.0)
MCHC: 34.1 g/dL (ref 31.5–35.7)
MCV: 96 fL (ref 79–97)
MONOS ABS: 0.6 10*3/uL (ref 0.1–0.9)
Monocytes: 12 %
NEUTROS ABS: 2.7 10*3/uL (ref 1.4–7.0)
Neutrophils: 53 %
PLATELETS: 225 10*3/uL (ref 150–379)
RBC: 4.56 x10E6/uL (ref 4.14–5.80)
RDW: 12.6 % (ref 12.3–15.4)
WBC: 5 10*3/uL (ref 3.4–10.8)

## 2018-02-08 LAB — COMPREHENSIVE METABOLIC PANEL
A/G RATIO: 2.3 — AB (ref 1.2–2.2)
ALK PHOS: 56 IU/L (ref 39–117)
ALT: 25 IU/L (ref 0–44)
AST: 39 IU/L (ref 0–40)
Albumin: 4.6 g/dL (ref 3.6–4.8)
BUN/Creatinine Ratio: 17 (ref 10–24)
BUN: 15 mg/dL (ref 8–27)
Bilirubin Total: 2.1 mg/dL — ABNORMAL HIGH (ref 0.0–1.2)
CO2: 21 mmol/L (ref 20–29)
Calcium: 9.2 mg/dL (ref 8.6–10.2)
Chloride: 103 mmol/L (ref 96–106)
Creatinine, Ser: 0.87 mg/dL (ref 0.76–1.27)
GFR calc Af Amer: 107 mL/min/{1.73_m2} (ref >59)
GFR calc non Af Amer: 92 mL/min/{1.73_m2} (ref >59)
GLOBULIN, TOTAL: 2 g/dL (ref 1.5–4.5)
Glucose: 105 mg/dL — ABNORMAL HIGH (ref 65–99)
POTASSIUM: 4.5 mmol/L (ref 3.5–5.2)
SODIUM: 141 mmol/L (ref 134–144)
Total Protein: 6.6 g/dL (ref 6.0–8.5)

## 2018-02-08 LAB — PSA TOTAL (REFLEX TO FREE): PROSTATE SPECIFIC AG, SERUM: 2.3 ng/mL (ref 0.0–4.0)

## 2018-02-08 LAB — LIPID PANEL
CHOLESTEROL TOTAL: 212 mg/dL — AB (ref 100–199)
Chol/HDL Ratio: 3.8 ratio (ref 0.0–5.0)
HDL: 56 mg/dL (ref >39)
LDL CALC: 142 mg/dL — AB (ref 0–99)
TRIGLYCERIDES: 68 mg/dL (ref 0–149)
VLDL CHOLESTEROL CAL: 14 mg/dL (ref 5–40)

## 2018-02-08 LAB — TSH: TSH: 2.48 u[IU]/mL (ref 0.450–4.500)

## 2018-02-08 NOTE — Telephone Encounter (Signed)
Pt advised. Pt agrees to start statin. [facility] [place]. Asked phlebotomist to add on labs.

## 2018-02-08 NOTE — Telephone Encounter (Signed)
-----   Message from Virginia Crews, MD sent at 02/08/2018 11:12 AM EDT ----- Cholesterol is high.  10 year risk of heart disease/stroke is high at 10.3%. Over 7.5%, need to consider statin medication to lower heart disease/stroke risk and lower mortality risk.  Could try 6 months of diet (low in saturated fats) and exercise and recheck check it before committing to a medication.  Fasting blood sugar is high.  Recommend low carb diet and exercise so this does not progress to diabetes.  Bilirubin is elevated, but stable from last several years.  Will send some confirmatory tests Raquel Sarna please add fractionated bilirubin with direct and indirect components).  Reassuring that liver function numbers are otherwise normal  Normal kidney functions, electrolytes, prostate, thyroid function, blood counts  Brita Romp Dionne Bucy, MD, MPH [facility] 02/08/2018 11:12 AM

## 2018-02-10 LAB — BILIRUBIN, FRACTIONATED(TOT/DIR/INDIR)
Bilirubin Total: 1.9 mg/dL — ABNORMAL HIGH (ref 0.0–1.2)
Bilirubin, Direct: 0.35 mg/dL (ref 0.00–0.40)
Bilirubin, Indirect: 1.55 mg/dL — ABNORMAL HIGH (ref 0.10–0.80)

## 2018-02-10 LAB — SPECIMEN STATUS REPORT

## 2018-02-13 ENCOUNTER — Telehealth: Payer: Self-pay

## 2018-02-13 NOTE — Telephone Encounter (Signed)
Patient advised as directed below.  Thanks,  -Green [place] 

## 2018-02-13 NOTE — Telephone Encounter (Signed)
-----   Message from Virginia Crews, MD sent at 02/12/2018  3:55 PM EDT ----- Bilirubin remains slightly elevated.  It appears it has been stable for years.  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  Next time labs are checked, we will check one other labs to ensure that there is no hemolysis (breakdown of red blood cells).  If patient develops jaundice (yellowing of the skin), he should let us know.  Virginia Crews, MD, MPH [facility] 02/12/2018 3:55 PM

## 2018-04-27 MED FILL — SHIPPING COST: 1 days supply | Qty: 1 | Fill #0

## 2018-04-27 MED FILL — LISINOPRIL 10 MG TABLET: 10 | 90 days supply | Qty: 90 | Fill #0

## 2018-05-10 ENCOUNTER — Ambulatory Visit: Payer: Self-pay | Admitting: Family Medicine

## 2018-05-22 ENCOUNTER — Ambulatory Visit: Payer: Self-pay | Admitting: Family Medicine

## 2018-07-23 ENCOUNTER — Other Ambulatory Visit: Payer: Self-pay | Admitting: Family Medicine

## 2018-07-23 NOTE — Telephone Encounter (Signed)
Assessment & Plan:   Currently well controlled With decreased stress and increased exercise, willing to try.  Off of medication We will hold lisinopril 10 mg for the next 3 months  Notes per 02/07/18 above  Ok to fill?

## 2018-07-23 NOTE — Telephone Encounter (Signed)
This decision would be up to Dr. Brita Romp, his new primary provider

## 2018-08-01 ENCOUNTER — Telehealth: Payer: Self-pay

## 2018-08-01 NOTE — Telephone Encounter (Signed)
This was stopped in 01/2018 as patient desired to get off of this medication and work on diet and exercise instead.  He was supposed to follow-up in 04/2018 to see if blood pressure was still well controlled without the medication.  Can you call the patient and find out if he is taking this medication or not and see if we can get him in here for follow-up visit?  Virginia Crews, MD, MPH [facility] 08/01/2018 12:45 PM

## 2018-08-01 NOTE — Telephone Encounter (Signed)
LMTCB

## 2018-08-01 NOTE — Telephone Encounter (Signed)
Refill request received from [facility] requesting Lisinopril 10 mg.

## 2018-08-02 MED ORDER — LISINOPRIL 10 MG PO TABS: 10.0000 mg | ORAL_TABLET | Freq: Every day | ORAL | 0 refills | Status: DC

## 2018-08-02 MED FILL — SHIPPING COST: 1 days supply | Qty: 1 | Fill #1

## 2018-08-02 MED FILL — LISINOPRIL 10 MG TABLET: 10 | 90 days supply | Qty: 90 | Fill #0

## 2018-08-02 NOTE — Telephone Encounter (Signed)
I called and spoke with patient, advising him as below. Patient states that he never stopped taking the Lisinopril. He states he has been taking Lisinopril 10 mg daily consistently since the last office visit. Patient has not been able to come in for a follow up appointment due to him traveling and moving. Patient plans to schedule an appointment for a follow up before Thanksgiving. He was not able to make an appointment while I had him on the phone because he didn't have his calendar in front of him. Patient would like a refill on the Lisinopril to get him by until he is seen. Please advise.

## 2018-08-02 NOTE — Telephone Encounter (Signed)
Refill sent for 90 day supply with 0 refills.  Hope that we will see patient prior to next refill.  Virginia Crews, MD, MPH [facility] 08/02/2018 2:44 PM

## 2018-08-02 NOTE — Telephone Encounter (Signed)
Pt returned call ° °teri °

## 2018-08-03 NOTE — Telephone Encounter (Signed)
Patient advised. He states he will call back to schedule a follow up appointment.  °

## 2018-09-19 ENCOUNTER — Encounter: Payer: Self-pay | Admitting: Family Medicine

## 2018-09-19 ENCOUNTER — Ambulatory Visit (INDEPENDENT_AMBULATORY_CARE_PROVIDER_SITE_OTHER): Payer: 59 | Admitting: Family Medicine

## 2018-09-19 VITALS — BP 112/72 | HR 57 | Temp 98.4°F | Ht 69.0 in | Wt 190.6 lb

## 2018-09-19 DIAGNOSIS — D6851 Activated protein C resistance: Secondary | ICD-10-CM

## 2018-09-19 DIAGNOSIS — Z23 Encounter for immunization: Secondary | ICD-10-CM | POA: Diagnosis not present

## 2018-09-19 DIAGNOSIS — I10 Essential (primary) hypertension: Secondary | ICD-10-CM

## 2018-09-19 DIAGNOSIS — G8929 Other chronic pain: Secondary | ICD-10-CM | POA: Diagnosis not present

## 2018-09-19 DIAGNOSIS — E7800 Pure hypercholesterolemia, unspecified: Secondary | ICD-10-CM

## 2018-09-19 DIAGNOSIS — M25561 Pain in right knee: Secondary | ICD-10-CM | POA: Diagnosis not present

## 2018-09-19 MED ORDER — LISINOPRIL 10 MG PO TABS: 5.0000 mg | ORAL_TABLET | Freq: Every day | ORAL | 1 refills | Status: DC

## 2018-09-19 MED ORDER — DICLOFENAC SODIUM 1 % TD GEL: 4.0000 g | Freq: Four times a day (QID) | TRANSDERMAL | 4 refills | Status: DC

## 2018-09-19 MED FILL — SHIPPING COST: 1 days supply | Qty: 1 | Fill #2

## 2018-09-19 MED FILL — DICLOFENAC SODIUM 1% GEL: 1 | 7 days supply | Qty: 100 | Fill #0

## 2018-09-19 NOTE — Patient Instructions (Signed)
Fat and Cholesterol Restricted Diet High levels of fat and cholesterol in your blood may lead to various health problems, such as diseases of the heart, blood vessels, gallbladder, liver, and pancreas. Fats are concentrated sources of energy that come in various forms. Certain types of fat, including saturated fat, may be harmful in excess. Cholesterol is a substance needed by your body in small amounts. Your body makes all the cholesterol it needs. Excess cholesterol comes from the food you eat. When you have high levels of cholesterol and saturated fat in your blood, health problems can develop because the excess fat and cholesterol will gather along the walls of your blood vessels, causing them to narrow. Choosing the right foods will help you control your intake of fat and cholesterol. This will help keep the levels of these substances in your blood within normal limits and reduce your risk of disease. What is my plan? Your health care provider recommends that you:  Limit your fat intake to ______% or less of your total calories per day.  Limit the amount of cholesterol in your diet to less than _________mg per day.  Eat 20-30 grams of fiber each day.  What types of fat should I choose?  Choose healthy fats more often. Choose monounsaturated and polyunsaturated fats, such as olive and canola oil, flaxseeds, walnuts, almonds, and seeds.  Eat more omega-3 fats. Good choices include salmon, mackerel, sardines, tuna, flaxseed oil, and ground flaxseeds. Aim to eat fish at least two times a week.  Limit saturated fats. Saturated fats are primarily found in animal products, such as meats, butter, and cream. Plant sources of saturated fats include palm oil, palm kernel oil, and coconut oil.  Avoid foods with partially hydrogenated oils in them. These contain trans fats. Examples of foods that contain trans fats are stick margarine, some tub margarines, cookies, crackers, and other baked goods. What  general guidelines do I need to follow? These guidelines for healthy eating will help you control your intake of fat and cholesterol:  Check food labels carefully to identify foods with trans fats or high amounts of saturated fat.  Fill one half of your plate with vegetables and green salads.  Fill one fourth of your plate with whole grains. Look for the word "whole" as the first word in the ingredient list.  Fill one fourth of your plate with lean protein foods.  Limit fruit to two servings a day. Choose fruit instead of juice.  Eat more foods that contain fiber, such as apples, broccoli, carrots, beans, peas, and barley.  Eat more home-cooked food and less restaurant, buffet, and fast food.  Limit or avoid alcohol.  Limit foods high in starch and sugar.  Limit fried foods.  Cook foods using methods other than frying. Baking, boiling, grilling, and broiling are all great options.  Lose weight if you are overweight. Losing just 5-10% of your initial body weight can help your overall health and prevent diseases such as diabetes and heart disease.  What foods can I eat? Grains  Whole grains, such as whole wheat or whole grain breads, crackers, cereals, and pasta. Unsweetened oatmeal, bulgur, barley, quinoa, or brown rice. Corn or whole wheat flour tortillas. Vegetables  Fresh or frozen vegetables (raw, steamed, roasted, or grilled). Green salads. Fruits  All fresh, canned (in natural juice), or frozen fruits. Meats and other protein foods  Ground beef (85% or leaner), grass-fed beef, or beef trimmed of fat. Skinless chicken or turkey. Ground chicken or turkey.   Pork trimmed of fat. All fish and seafood. Eggs. Dried beans, peas, or lentils. Unsalted nuts or seeds. Unsalted canned or dry beans. Dairy  Low-fat dairy products, such as skim or 1% milk, 2% or reduced-fat cheeses, low-fat ricotta or cottage cheese, or plain low-fat yo Fats and oils  Tub margarines without trans  fats. Light or reduced-fat mayonnaise and salad dressings. Avocado. Olive, canola, sesame, or safflower oils. Natural peanut or almond butter (choose ones without added sugar and oil). The items listed above may not be a complete list of recommended foods or beverages. Contact your dietitian for more options. Foods to avoid Grains  White bread. White pasta. White rice. Cornbread. Bagels, pastries, and croissants. Crackers that contain trans fat. Vegetables  White potatoes. Corn. Creamed or fried vegetables. Vegetables in a cheese sauce. Fruits  Dried fruits. Canned fruit in light or heavy syrup. Fruit juice. Meats and other protein foods  Fatty cuts of meat. Ribs, chicken wings, bacon, sausage, bologna, salami, chitterlings, fatback, hot dogs, bratwurst, and packaged luncheon meats. Liver and organ meats. Dairy  Whole or 2% milk, cream, half-and-half, and cream cheese. Whole milk cheeses. Whole-fat or sweetened yogurt. Full-fat cheeses. Nondairy creamers and whipped toppings. Processed cheese, cheese spreads, or cheese curds. Beverages  Alcohol. Sweetened drinks (such as sodas, lemonade, and fruit drinks or punches). Fats and oils  Butter, stick margarine, lard, shortening, ghee, or bacon fat. Coconut, palm kernel, or palm oils. Sweets and desserts  Corn syrup, sugars, honey, and molasses. Candy. Jam and jelly. Syrup. Sweetened cereals. Cookies, pies, cakes, donuts, muffins, and ice cream. The items listed above may not be a complete list of foods and beverages to avoid. Contact your dietitian for more information. This information is not intended to replace advice given to you by your health care provider. Make sure you discuss any questions you have with your health care provider. Document Released: 10/31/2005 Document Revised: 11/21/2014 Document Reviewed: 01/29/2014 Elsevier Interactive Patient Education  2018 [employer] Inc[employer]  

## 2018-09-19 NOTE — Assessment & Plan Note (Signed)
Would like to recheck labs before considering starting medication. Will recheck at physical in the new year. Call with any concerns.

## 2018-09-19 NOTE — Assessment & Plan Note (Signed)
Under good control on current regimen. Will consider decreasing to 5mg  daily and rechecking at physical in the new year. Call with any concerns.

## 2018-09-19 NOTE — Progress Notes (Signed)
BP 112/72 (BP Location: Left Arm, Patient Position: Sitting, Cuff Size: Normal)   Pulse (!) 57   Temp 98.4 F (36.9 C)   Ht 5\' 9"  (1.753 m)   Wt 190 lb 9 oz (86.4 kg)   SpO2 96%   BMI 28.14 kg/m    Subjective:    Patient ID: Joe Molina, male    DOB: 06-13-1956, 62 y.o.   MRN: [id number]  HPI: MADDOCK FINIGAN is a 62 y.o. male who presents today to establish care.  Chief Complaint  Patient presents with  . Establish Care  . Hypertension  . Hyperlipidemia   HYPERTENSION / HYPERLIPIDEMIA Satisfied with current treatment? yes Duration of hypertension: chronic BP monitoring frequency: not checking BP range:  BP medication side effects: no Past BP meds: lisinopril Duration of hyperlipidemia: chronic Cholesterol medication side effects: Not on anything Cholesterol supplements: fish oil Past cholesterol medications: none Medication compliance: excellent compliance Aspirin: yes Recent stressors: no Recurrent headaches: no Visual changes: no Palpitations: no Dyspnea: no Chest pain: no Lower extremity edema: no Dizzy/lightheaded: no   Chronic knee pain- usually with stairs or with biking. Not really taking anything for it right now.    Active Ambulatory Problems    Diagnosis Date Noted  . Hyperlipidemia 08/11/2008  . Essential hypertension 06/25/2007  . TENOSYNOVITIS OF FOOT AND ANKLE 10/15/2007  . Personal history of colonic adenomas 10/19/2010  . Factor V Leiden mutation ([place]) 11/28/2013  . GERD (gastroesophageal reflux disease)    Resolved Ambulatory Problems    Diagnosis Date Noted  . Anxiety state, unspecified 08/20/2010  . HEMORRHOID, EXTERNAL, THROMBOSED 11/16/2009  . GERD 08/11/2008  . GASTROENTERITIS 06/16/2009  . BURSITIS 10/15/2007  . NUMBNESS 03/03/2009  . HEEL PAIN 11/17/2010  . CAP (community acquired pneumonia) 11/01/2013  . Acute respiratory failure with hypoxia ([place]) 11/02/2013  . Sepsis(995.91) 11/02/2013  . Subacute massive pulmonary  embolism ([place]) 11/03/2013  . DVT (deep venous thrombosis) ([place]) 11/04/2013  . Coagulopathy ([place]) 11/28/2013  . Anemia in other chronic diseases classified elsewhere 04/24/2014  . Lupus anticoagulant disorder ([place]) 04/24/2014  . Abdominal muscle pain 11/27/2014  . Chronic left hip pain 06/25/2015   Past Medical History:  Diagnosis Date  . Hx of pulmonary embolus 11/01/2013  . Hypertension    Past Surgical History:  Procedure Laterality Date  . CHOLECYSTECTOMY  06//06   Dr. Deon Pilling  . COLONOSCOPY  06-04-14   per Dr. Carlean Purl, benign polyps, repeat in 5 yrs (hx of adenomas)   . TONSILLECTOMY  1961   Outpatient Encounter Medications as of 09/19/2018  Medication Sig  . aspirin 81 MG tablet Take 162 mg by mouth daily.  . cholecalciferol (VITAMIN D) 1000 UNITS tablet Take 1,000 Units by mouth daily.  . diclofenac sodium (VOLTAREN) 1 % GEL Apply 4 g topically 4 (four) times daily.  Marland Kitchen FIBER PO Take by mouth.  . Glucosamine HCl (GLUCOSAMINE PO) Take by mouth.  Marland Kitchen lisinopril (PRINIVIL,ZESTRIL) 10 MG tablet Take 0.5-1 tablets (5-10 mg total) by mouth daily.  . Multiple Vitamin (MULTIVITAMIN) tablet Take 1 tablet by mouth daily. 50 plus  . Omega-3 Fatty Acids (FISH OIL PO) Take 1 capsule by mouth daily.   . [DISCONTINUED] lisinopril (PRINIVIL,ZESTRIL) 10 MG tablet Take 1 tablet (10 mg total) by mouth daily.   No facility-administered encounter medications on file as of 09/19/2018.    Allergies  Allergen Reactions  . Codeine Other (See Comments)  . Codeine Sulfate     REACTION: nausea  Social History   Socioeconomic History  . Marital status: Married    Spouse name: Corporate treasurer  . Number of children: 2  . Years of education: 15  . Highest education level: Doctorate  Occupational History  . Occupation: Retired    Comment: Estate agent, Research scientist (physical sciences)  Social Needs  . Financial resource strain: Not hard at all  . Food insecurity:    Worry: Never true    Inability: Never true    . Transportation needs:    Medical: No    Non-medical: No  Tobacco Use  . Smoking status: Never Smoker  . Smokeless tobacco: Never Used  Substance and Sexual Activity  . Alcohol use: Yes    Alcohol/week: 14.0 standard drinks    Types: 14 Standard drinks or equivalent per week    Comment: 2 liquor drinks daily  . Drug use: No  . Sexual activity: Yes  Lifestyle  . Physical activity:    Days per week: 3 days    Minutes per session: 60 min  . Stress: Not on file  Relationships  . Social connections:    Talks on phone: Not on file    Gets together: Not on file    Attends religious service: Not on file    Active member of club or organization: Not on file    Attends meetings of clubs or organizations: Not on file    Relationship status: Not on file  Other Topics Concern  . Not on file  Social History Narrative  . Not on file   Family History  Problem Relation Age of Onset  . Clotting disorder Mother        leg DVT  . Heart disease Mother   . Heart attack Father 11  . Prostate cancer Father 31  . Anemia Sister   . Anemia Brother   . Mental illness Brother   . Colon cancer Neg Hx    Review of Systems  Constitutional: Negative.   Respiratory: Negative.   Cardiovascular: Negative.   Neurological: Negative.   Psychiatric/Behavioral: Negative.     Per HPI unless specifically indicated above     Objective:    BP 112/72 (BP Location: Left Arm, Patient Position: Sitting, Cuff Size: Normal)   Pulse (!) 57   Temp 98.4 F (36.9 C)   Ht 5\' 9"  (1.753 m)   Wt 190 lb 9 oz (86.4 kg)   SpO2 96%   BMI 28.14 kg/m   Wt Readings from Last 3 Encounters:  09/19/18 190 lb 9 oz (86.4 kg)  02/07/18 189 lb (85.7 kg)  02/07/17 191 lb (86.6 kg)    Physical Exam  Constitutional: He is oriented to person, place, and time. He appears well-developed and well-nourished. No distress.  HENT:  Head: Normocephalic and atraumatic.  Right Ear: Hearing normal.  Left Ear: Hearing normal.   Nose: Nose normal.  Eyes: Conjunctivae and lids are normal. Right eye exhibits no discharge. Left eye exhibits no discharge. No scleral icterus.  Cardiovascular: Normal rate, regular rhythm, normal heart sounds and intact distal pulses. Exam reveals no gallop and no friction rub.  No murmur heard. Pulmonary/Chest: Effort normal and breath sounds normal. No stridor. No respiratory distress. He has no wheezes. He has no rales. He exhibits no tenderness.  Musculoskeletal: Normal range of motion.  Neurological: He is alert and oriented to person, place, and time.  Skin: Skin is warm, dry and intact. Capillary refill takes less than 2 seconds. No rash noted. He  is not diaphoretic. No erythema. No pallor.  Psychiatric: He has a normal mood and affect. His speech is normal and behavior is normal. Judgment and thought content normal. Cognition and memory are normal.  Nursing note and vitals reviewed.   Results for orders placed or performed in visit on 02/07/18  Lipid panel  Result Value Ref Range   Cholesterol, Total 212 (H) 100 - 199 mg/dL   Triglycerides 68 0 - 149 mg/dL   HDL 56 >39 mg/dL   VLDL Cholesterol Cal 14 5 - 40 mg/dL   LDL Calculated 142 (H) 0 - 99 mg/dL   Chol/HDL Ratio 3.8 0.0 - 5.0 ratio  Comprehensive metabolic panel  Result Value Ref Range   Glucose 105 (H) 65 - 99 mg/dL   BUN 15 8 - 27 mg/dL   Creatinine, Ser 0.87 0.76 - 1.27 mg/dL   GFR calc non Af Amer 92 >59 mL/min/1.73   GFR calc Af Amer 107 >59 mL/min/1.73   BUN/Creatinine Ratio 17 10 - 24   Sodium 141 134 - 144 mmol/L   Potassium 4.5 3.5 - 5.2 mmol/L   Chloride 103 96 - 106 mmol/L   CO2 21 20 - 29 mmol/L   Calcium 9.2 8.6 - 10.2 mg/dL   Total Protein 6.6 6.0 - 8.5 g/dL   Albumin 4.6 3.6 - 4.8 g/dL   Globulin, Total 2.0 1.5 - 4.5 g/dL   Albumin/Globulin Ratio 2.3 (H) 1.2 - 2.2   Bilirubin Total 2.1 (H) 0.0 - 1.2 mg/dL   Alkaline Phosphatase 56 39 - 117 IU/L   AST 39 0 - 40 IU/L   ALT 25 0 - 44 IU/L  PSA  Total (Reflex To Free)  Result Value Ref Range   Prostate Specific Ag, Serum 2.3 0.0 - 4.0 ng/mL   Reflex Criteria Comment   TSH  Result Value Ref Range   TSH 2.480 0.450 - 4.500 uIU/mL  CBC w/Diff/Platelet  Result Value Ref Range   WBC 5.0 3.4 - 10.8 x10E3/uL   RBC 4.56 4.14 - 5.80 x10E6/uL   Hemoglobin 14.9 13.0 - 17.7 g/dL   Hematocrit 43.7 37.5 - 51.0 %   MCV 96 79 - 97 fL   MCH 32.7 26.6 - 33.0 pg   MCHC 34.1 31.5 - 35.7 g/dL   RDW 12.6 12.3 - 15.4 %   Platelets 225 150 - 379 x10E3/uL   Neutrophils 53 Not Estab. %   Lymphs 33 Not Estab. %   Monocytes 12 Not Estab. %   Eos 2 Not Estab. %   Basos 0 Not Estab. %   Neutrophils Absolute 2.7 1.4 - 7.0 x10E3/uL   Lymphocytes Absolute 1.6 0.7 - 3.1 x10E3/uL   Monocytes Absolute 0.6 0.1 - 0.9 x10E3/uL   EOS (ABSOLUTE) 0.1 0.0 - 0.4 x10E3/uL   Basophils Absolute 0.0 0.0 - 0.2 x10E3/uL   Immature Granulocytes 0 Not Estab. %   Immature Grans (Abs) 0.0 0.0 - 0.1 x10E3/uL  Bilirubin, fractionated(tot/dir/indir)  Result Value Ref Range   Bilirubin Total 1.9 (H) 0.0 - 1.2 mg/dL   Bilirubin, Direct 0.35 0.00 - 0.40 mg/dL   Bilirubin, Indirect 1.55 (H) 0.10 - 0.80 mg/dL  Specimen status report  Result Value Ref Range   specimen status report Comment       Assessment & Plan:   Problem List Items Addressed This Visit      Cardiovascular and Mediastinum   Essential hypertension - Primary    Under good control on current regimen. Will  consider decreasing to 5mg  daily and rechecking at physical in the new year. Call with any concerns.       Relevant Medications   lisinopril (PRINIVIL,ZESTRIL) 10 MG tablet     Hematopoietic and Hemostatic   Factor V Leiden mutation ([place])    Continues on 162mg  of aspirin. Call with any concerns. Continue to monitor.         Other   Hyperlipidemia    Would like to recheck labs before considering starting medication. Will recheck at physical in the new year. Call with any concerns.        Relevant Medications   lisinopril (PRINIVIL,ZESTRIL) 10 MG tablet    Other Visit Diagnoses    Chronic pain of right knee       Rx for voltaren sent- has factor v leiden and is on anti-coagulation so cannot take oral NSAIDs. Use PRN.       Follow up plan: Return In the new year, for Physical.

## 2018-09-19 NOTE — Assessment & Plan Note (Signed)
Continues on 162mg  of aspirin. Call with any concerns. Continue to monitor.

## 2018-11-22 ENCOUNTER — Encounter: Payer: 59 | Admitting: Family Medicine

## 2018-12-21 MED FILL — SHINGRIX 50 MCG SUS: 50 | 1 days supply | Qty: 1 | Fill #0

## 2018-12-25 ENCOUNTER — Encounter: Payer: 59 | Admitting: Family Medicine

## 2018-12-27 MED FILL — SHIPPING COST: 1 days supply | Qty: 1 | Fill #3

## 2018-12-27 MED FILL — LISINOPRIL 10 MG TABLET: 10 | 90 days supply | Qty: 90 | Fill #0

## 2019-01-28 ENCOUNTER — Encounter: Payer: Self-pay | Admitting: Family Medicine

## 2019-02-18 ENCOUNTER — Encounter: Payer: Self-pay | Admitting: Family Medicine

## 2019-05-09 ENCOUNTER — Encounter: Payer: Self-pay | Admitting: Internal Medicine

## 2019-05-21 ENCOUNTER — Encounter: Payer: Self-pay | Admitting: Family Medicine

## 2019-06-03 ENCOUNTER — Encounter: Payer: Self-pay | Admitting: Internal Medicine

## 2019-06-07 ENCOUNTER — Telehealth: Payer: Self-pay | Admitting: Family Medicine

## 2019-06-07 NOTE — Telephone Encounter (Signed)
Patient has not been seen since November. Needs an appointment.

## 2019-06-07 NOTE — Telephone Encounter (Signed)
Pt would like a refill for lisinopril sent to [facility] so he can assure he won't run out before September appt. Pt would also like to know if he could get in before September. Please advise.

## 2019-06-10 ENCOUNTER — Inpatient Hospital Stay: Admission: RE | Admit: 2019-06-10 | Payer: 59 | Source: Ambulatory Visit

## 2019-06-10 ENCOUNTER — Other Ambulatory Visit: Payer: Self-pay | Admitting: Family Medicine

## 2019-06-10 DIAGNOSIS — R509 Fever, unspecified: Secondary | ICD-10-CM | POA: Diagnosis not present

## 2019-06-10 DIAGNOSIS — R0981 Nasal congestion: Secondary | ICD-10-CM | POA: Diagnosis not present

## 2019-06-10 MED ORDER — LISINOPRIL 10 MG PO TABS: 5.0000 mg | ORAL_TABLET | Freq: Every day | ORAL | 0 refills | Status: DC

## 2019-06-10 MED FILL — LISINOPRIL 10 MG TABS: 10 | 45 days supply | Qty: 45 | Fill #0

## 2019-06-10 NOTE — Telephone Encounter (Signed)
Dr Wynetta Emery has refilled medication to last him until his appt in Sept.

## 2019-06-10 NOTE — Telephone Encounter (Signed)
Called pt to schedule, no answer, left vm 

## 2019-06-11 ENCOUNTER — Telehealth: Payer: 59

## 2019-06-11 DIAGNOSIS — R350 Frequency of micturition: Secondary | ICD-10-CM | POA: Diagnosis not present

## 2019-06-12 ENCOUNTER — Ambulatory Visit: Payer: 59 | Admitting: *Deleted

## 2019-06-12 ENCOUNTER — Other Ambulatory Visit: Payer: Self-pay

## 2019-06-12 VITALS — Ht 70.0 in | Wt 185.0 lb

## 2019-06-12 DIAGNOSIS — Z86010 Personal history of colonic polyps: Secondary | ICD-10-CM

## 2019-06-12 MED ORDER — NA SULFATE-K SULFATE-MG SULF 17.5-3.13-1.6 GM/177ML PO SOLN: ORAL | 0 refills | Status: DC

## 2019-06-12 NOTE — Progress Notes (Signed)
Patient's pre-visit was done today over the phone with the patient due to COVID-19 pandemic. Name,DOB and address verified. Insurance verified. Packet of Prep instructions mailed to patient including copy of a consent form and pre-procedure patient acknowledgement form-pt is aware. Suprep $15 off Coupon included. Patient understands to call us back with any questions or concerns.  Patient denies any allergies to eggs or soy. Patient denies any problems with anesthesia/sedation. Patient denies any oxygen use at home. Patient denies taking any diet/weight loss medications or blood thinners. EMMI education assisgned to patient on colonoscopy, this was explained and instructions given to patient. Pt is aware that care partner will wait in the car during procedure; if they feel like they will be too hot to wait in the car; they may wait in the lobby.  We want them to wear a mask (we do not have any that we can provide them), practice social distancing, and we will check their temperatures when they get here.  I did remind patient that their care partner needs to stay in the parking lot the entire time. Pt will wear mask into building.

## 2019-06-27 ENCOUNTER — Encounter: Payer: Self-pay | Admitting: Internal Medicine

## 2019-07-02 ENCOUNTER — Telehealth: Payer: Self-pay

## 2019-07-02 MED FILL — SHINGRIX 50 MCG SUS: 50 | 1 days supply | Qty: 1 | Fill #1

## 2019-07-02 NOTE — Telephone Encounter (Signed)
Covid-19 screening questions   Do you now or have you had a fever in the last 14 days? No  Do you have any respiratory symptoms of shortness of breath or cough now or in the last 14 days? No  Do you have any family members or close contacts with diagnosed or suspected Covid-19 in the past 14 days? No  Have you been tested for Covid-19 and found to be positive? No    Confirmed with patient   

## 2019-07-03 ENCOUNTER — Encounter: Payer: Self-pay | Admitting: Internal Medicine

## 2019-07-03 ENCOUNTER — Other Ambulatory Visit: Payer: Self-pay

## 2019-07-03 ENCOUNTER — Ambulatory Visit (AMBULATORY_SURGERY_CENTER): Payer: 59 | Admitting: Internal Medicine

## 2019-07-03 VITALS — BP 123/75 | HR 46 | Temp 97.8°F | Resp 13 | Ht 70.0 in | Wt 185.0 lb

## 2019-07-03 DIAGNOSIS — Z1211 Encounter for screening for malignant neoplasm of colon: Secondary | ICD-10-CM | POA: Diagnosis not present

## 2019-07-03 DIAGNOSIS — K635 Polyp of colon: Secondary | ICD-10-CM

## 2019-07-03 DIAGNOSIS — Z86010 Personal history of colonic polyps: Secondary | ICD-10-CM | POA: Diagnosis not present

## 2019-07-03 DIAGNOSIS — D125 Benign neoplasm of sigmoid colon: Secondary | ICD-10-CM

## 2019-07-03 DIAGNOSIS — D122 Benign neoplasm of ascending colon: Secondary | ICD-10-CM

## 2019-07-03 MED ORDER — SODIUM CHLORIDE 0.9 % IV SOLN: 500.0000 mL | Freq: Once | INTRAVENOUS | Status: DC

## 2019-07-03 NOTE — Progress Notes (Signed)
PT taken to PACU. Monitors in place. VSS. Report given to RN. 

## 2019-07-03 NOTE — Progress Notes (Signed)
Pt's states no medical or surgical changes since previsit or office visit. 

## 2019-07-03 NOTE — Progress Notes (Signed)
JB- Temp CW- Vitals 

## 2019-07-03 NOTE — Op Note (Addendum)
[facility] Patient Name: Joe Molina Procedure Date: 07/03/2019 8:05 AM MRN: [id number] Endoscopist: Gatha Mayer , MD Age: 63 Referring MD:  Date of Birth: 14-Mar-1956 Gender: Male Account #: [id number] Procedure:                Colonoscopy Indications:              Surveillance: Personal history of adenomatous                            polyps on last colonoscopy 5 years ago Medicines:                Propofol per Anesthesia, Monitored Anesthesia Care Procedure:                Pre-Anesthesia Assessment:                           - Prior to the procedure, a History and Physical                            was performed, and patient medications and                            allergies were reviewed. The patient's tolerance of                            previous anesthesia was also reviewed. The risks                            and benefits of the procedure and the sedation                            options and risks were discussed with the patient.                            All questions were answered, and informed consent                            was obtained. Prior Anticoagulants: The patient has                            taken no previous anticoagulant or antiplatelet                            agents. ASA Grade Assessment: II - A patient with                            mild systemic disease. After reviewing the risks                            and benefits, the patient was deemed in                            satisfactory condition to undergo the procedure.  After obtaining informed consent, the colonoscope                            was passed under direct vision. Throughout the                            procedure, the patient's blood pressure, pulse, and                            oxygen saturations were monitored continuously. The                            Colonoscope was introduced through the anus and                            advanced  to the the cecum, identified by                            appendiceal orifice and ileocecal valve. The                            colonoscopy was performed without difficulty. The                            patient tolerated the procedure well. The quality                            of the bowel preparation was excellent. The bowel                            preparation used was SUPREP via split dose                            instruction. The ileocecal valve, appendiceal                            orifice, and rectum were photographed. Scope In: 8:09:52 AM Scope Out: 8:26:12 AM Scope Withdrawal Time: 0 hours 15 minutes 2 seconds  Total Procedure Duration: 0 hours 16 minutes 20 seconds  Findings:                 The perianal and digital rectal examinations were                            normal. Pertinent negatives include normal prostate                            (size, shape, and consistency).                           Four sessile polyps were found in the sigmoid colon                            and ascending colon. The polyps were diminutive in  size. These polyps were removed with a cold snare.                            Resection and retrieval were complete. Verification                            of patient identification for the specimen was                            done. Estimated blood loss was minimal.                           Multiple diverticula were found in the sigmoid                            colon.                           External and internal hemorrhoids were found.                           The exam was otherwise without abnormality on                            direct and retroflexion views. Complications:            No immediate complications. Estimated Blood Loss:     Estimated blood loss was minimal. Impression:               - Four diminutive polyps in the sigmoid colon and                            in the ascending colon,  removed with a cold snare.                            Resected and retrieved.                           - Diverticulosis in the sigmoid colon.                           - External and internal hemorrhoids.                           - The examination was otherwise normal on direct                            and retroflexion views.                           - Personal history of colonic polyps. 1 cm adenoma                            and ssp/a 2011 Recommendation:           - Patient has a contact number available for  emergencies. The signs and symptoms of potential                            delayed complications were discussed with the                            patient. Return to normal activities tomorrow.                            Written discharge instructions were provided to the                            patient.                           - Resume previous diet.                           - Continue present medications.                           - Repeat colonoscopy is recommended for                            surveillance. The colonoscopy date will be                            determined after pathology results from today's                            exam become available for review. Gatha Mayer, MD 07/03/2019 8:37:54 AM This report has been signed electronically.

## 2019-07-03 NOTE — Patient Instructions (Addendum)
I found and removed 4 tiny polyps. I will let you know pathology results and when to have another routine colonoscopy by mail and/or My Chart.  You also have a condition called diverticulosis - common and not usually a problem. Please read the handout provided.  Also hemorrhoids - normal finding.  It was great to see you and I am glad you are enjoying retirement.  I appreciate the opportunity to care for you. Gatha Mayer, MD, [facility]    HANDOUTS ON POLYPS,DIVERTICULOSIS,AND HEMORRHOIDS GIVEN TO YOU TODAY  AWAIT PATHOLOGY RESULTS ON POLYPS REMOVED   YOU HAD AN ENDOSCOPIC PROCEDURE TODAY AT [facility]:   Refer to the procedure report that was given to you for any specific questions about what was found during the examination.  If the procedure report does not answer your questions, please call your gastroenterologist to clarify.  If you requested that your care partner not be given the details of your procedure findings, then the procedure report has been included in a sealed envelope for you to review at your convenience later.  YOU SHOULD EXPECT: Some feelings of bloating in the abdomen. Passage of more gas than usual.  Walking can help get rid of the air that was put into your GI tract during the procedure and reduce the bloating. If you had a lower endoscopy (such as a colonoscopy or flexible sigmoidoscopy) you may notice spotting of blood in your stool or on the toilet paper. If you underwent a bowel prep for your procedure, you may not have a normal bowel movement for a few days.  Please Note:  You might notice some irritation and congestion in your nose or some drainage.  This is from the oxygen used during your procedure.  There is no need for concern and it should clear up in a day or so.  SYMPTOMS TO REPORT IMMEDIATELY:   Following lower endoscopy (colonoscopy or flexible sigmoidoscopy):  Excessive amounts of blood in the stool  Significant tenderness  or worsening of abdominal pains  Swelling of the abdomen that is new, acute  Fever of 100F or higher    For urgent or emergent issues, a gastroenterologist can be reached at any hour by calling [phone].   DIET:  We do recommend a small meal at first, but then you may proceed to your regular diet.  Drink plenty of fluids but you should avoid alcoholic beverages for 24 hours.  ACTIVITY:  You should plan to take it easy for the rest of today and you should NOT DRIVE or use heavy machinery until tomorrow (because of the sedation medicines used during the test).    FOLLOW UP: Our staff will call the number listed on your records 48-72 hours following your procedure to check on you and address any questions or concerns that you may have regarding the information given to you following your procedure. If we do not reach you, we will leave a message.  We will attempt to reach you two times.  During this call, we will ask if you have developed any symptoms of COVID 19. If you develop any symptoms (ie: fever, flu-like symptoms, shortness of breath, cough etc.) before then, please call [phone].  If you test positive for Covid 19 in the 2 weeks post procedure, please call and report this information to [place].    If any biopsies were taken you will be contacted by phone or by letter within the next 1-3 weeks.  Please  call us at [phone] if you have not heard about the biopsies in 3 weeks.    SIGNATURES/CONFIDENTIALITY: You and/or your care partner have signed paperwork which will be entered into your electronic medical record.  These signatures attest to the fact that that the information above on your After Visit Summary has been reviewed and is understood.  Full responsibility of the confidentiality of this discharge information lies with you and/or your care-partner.

## 2019-07-03 NOTE — Progress Notes (Signed)
Called to room to assist during endoscopic procedure.  Patient ID and intended procedure confirmed with present staff. Received instructions for my participation in the procedure from the performing physician.  

## 2019-07-05 ENCOUNTER — Telehealth: Payer: Self-pay

## 2019-07-05 NOTE — Telephone Encounter (Signed)
  Follow up Call-  Call back number 07/03/2019  Post procedure Call Back phone  # [phone]  Permission to leave phone message Yes  Some recent data might be hidden     Patient questions:  Do you have a fever, pain , or abdominal swelling? No. Pain Score  0 *  Have you tolerated food without any problems? Yes.    Have you been able to return to your normal activities? Yes.    Do you have any questions about your discharge instructions: Diet   No. Medications  No. Follow up visit  No.  Do you have questions or concerns about your Care? No.  Actions: * If pain score is 4 or above: No action needed, pain <4.  1. Have you developed a fever since your procedure? no  2.   Have you had an respiratory symptoms (SOB or cough) since your procedure? no  3.   Have you tested positive for COVID 19 since your procedure no  4.   Have you had any family members/close contacts diagnosed with the COVID 19 since your procedure?  no   If yes to any of these questions please route to Joylene John, RN and Alphonsa Gin, Therapist, sports.

## 2019-07-12 ENCOUNTER — Encounter: Payer: Self-pay | Admitting: Internal Medicine

## 2019-07-12 NOTE — Progress Notes (Signed)
ssp and benign mucosal polyps All diminutive Recall colon 2025 My Chart letter

## 2019-07-23 ENCOUNTER — Encounter: Payer: Self-pay | Admitting: Family Medicine

## 2019-08-13 ENCOUNTER — Other Ambulatory Visit: Payer: Self-pay

## 2019-08-13 ENCOUNTER — Encounter: Payer: Self-pay | Admitting: Family Medicine

## 2019-08-13 ENCOUNTER — Ambulatory Visit (INDEPENDENT_AMBULATORY_CARE_PROVIDER_SITE_OTHER): Payer: 59 | Admitting: Family Medicine

## 2019-08-13 VITALS — BP 138/80 | HR 55 | Temp 98.5°F | Ht 69.5 in | Wt 186.0 lb

## 2019-08-13 DIAGNOSIS — R6882 Decreased libido: Secondary | ICD-10-CM

## 2019-08-13 DIAGNOSIS — Z23 Encounter for immunization: Secondary | ICD-10-CM | POA: Diagnosis not present

## 2019-08-13 DIAGNOSIS — I10 Essential (primary) hypertension: Secondary | ICD-10-CM | POA: Diagnosis not present

## 2019-08-13 DIAGNOSIS — Z0000 Encounter for general adult medical examination without abnormal findings: Secondary | ICD-10-CM

## 2019-08-13 DIAGNOSIS — E7800 Pure hypercholesterolemia, unspecified: Secondary | ICD-10-CM

## 2019-08-13 DIAGNOSIS — D6851 Activated protein C resistance: Secondary | ICD-10-CM

## 2019-08-13 DIAGNOSIS — Z125 Encounter for screening for malignant neoplasm of prostate: Secondary | ICD-10-CM

## 2019-08-13 LAB — MICROALBUMIN, URINE WAIVED
Creatinine, Urine Waived: 50 mg/dL (ref 10–300)
Microalb, Ur Waived: 30 mg/L — ABNORMAL HIGH (ref 0–19)

## 2019-08-13 LAB — UA/M W/RFLX CULTURE, ROUTINE
Bilirubin, UA: NEGATIVE
Glucose, UA: NEGATIVE
Ketones, UA: NEGATIVE
Leukocytes,UA: NEGATIVE
Nitrite, UA: NEGATIVE
Protein,UA: NEGATIVE
RBC, UA: NEGATIVE
Specific Gravity, UA: 1.015 (ref 1.005–1.030)
Urobilinogen, Ur: 0.2 mg/dL (ref 0.2–1.0)
pH, UA: 7.5 (ref 5.0–7.5)

## 2019-08-13 MED ORDER — DICLOFENAC SODIUM 1 % TD GEL: 4.0000 g | Freq: Four times a day (QID) | TRANSDERMAL | 12 refills | Status: AC

## 2019-08-13 MED ORDER — LISINOPRIL 10 MG PO TABS: 5.0000 mg | ORAL_TABLET | Freq: Every day | ORAL | 1 refills | Status: AC

## 2019-08-13 NOTE — Patient Instructions (Signed)
Health Maintenance, Male Adopting a healthy lifestyle and getting preventive care are important in promoting health and wellness. Ask your health care provider about:  The right schedule for you to have regular tests and exams.  Things you can do on your own to prevent diseases and keep yourself healthy. What should I know about diet, weight, and exercise? Eat a healthy diet   Eat a diet that includes plenty of vegetables, fruits, low-fat dairy products, and lean protein.  Do not eat a lot of foods that are high in solid fats, added sugars, or sodium. Maintain a healthy weight Body mass index (BMI) is a measurement that can be used to identify possible weight problems. It estimates body fat based on height and weight. Your health care provider can help determine your BMI and help you achieve or maintain a healthy weight. Get regular exercise Get regular exercise. This is one of the most important things you can do for your health. Most adults should:  Exercise for at least 150 minutes each week. The exercise should increase your heart rate and make you sweat (moderate-intensity exercise).  Do strengthening exercises at least twice a week. This is in addition to the moderate-intensity exercise.  Spend less time sitting. Even light physical activity can be beneficial. Watch cholesterol and blood lipids Have your blood tested for lipids and cholesterol at 64 years of age, then have this test every 5 years. You may need to have your cholesterol levels checked more often if:  Your lipid or cholesterol levels are high.  You are older than 64 years of age.  You are at high risk for heart disease. What should I know about cancer screening? Many types of cancers can be detected early and may often be prevented. Depending on your health history and family history, you may need to have cancer screening at various ages. This may include screening for:  Colorectal cancer.  Prostate  cancer.  Skin cancer.  Lung cancer. What should I know about heart disease, diabetes, and high blood pressure? Blood pressure and heart disease  High blood pressure causes heart disease and increases the risk of stroke. This is more likely to develop in people who have high blood pressure readings, are of African descent, or are overweight.  Talk with your health care provider about your target blood pressure readings.  Have your blood pressure checked: ? Every 3-5 years if you are 30-46 years of age. ? Every year if you are 78 years old or older.  If you are between the ages of 69 and 47 and are a current or former smoker, ask your health care provider if you should have a one-time screening for abdominal aortic aneurysm (AAA). Diabetes Have regular diabetes screenings. This checks your fasting blood sugar level. Have the screening done:  Once every three years after age 60 if you are at a normal weight and have a low risk for diabetes.  More often and at a younger age if you are overweight or have a high risk for diabetes. What should I know about preventing infection? Hepatitis B If you have a higher risk for hepatitis B, you should be screened for this virus. Talk with your health care provider to find out if you are at risk for hepatitis B infection. Hepatitis C Blood testing is recommended for:  Everyone born from [birth year] through 1966.  Anyone with known risk factors for hepatitis C. Sexually transmitted infections (STIs)  You should be screened each year  for STIs, including gonorrhea and chlamydia, if: ? You are sexually active and are younger than 64 years of age. ? You are older than 64 years of age and your health care provider tells you that you are at risk for this type of infection. ? Your sexual activity has changed since you were last screened, and you are at increased risk for chlamydia or gonorrhea. Ask your health care provider if you are at risk.  Ask your  health care provider about whether you are at high risk for HIV. Your health care provider may recommend a prescription medicine to help prevent HIV infection. If you choose to take medicine to prevent HIV, you should first get tested for HIV. You should then be tested every 3 months for as long as you are taking the medicine. Follow these instructions at home: Lifestyle  Do not use any products that contain nicotine or tobacco, such as cigarettes, e-cigarettes, and chewing tobacco. If you need help quitting, ask your health care provider.  Do not use street drugs.  Do not share needles.  Ask your health care provider for help if you need support or information about quitting drugs. Alcohol use  Do not drink alcohol if your health care provider tells you not to drink.  If you drink alcohol: ? Limit how much you have to 0-2 drinks a day. ? Be aware of how much alcohol is in your drink. In the [place] one drink equals one 12 oz bottle of beer (355 mL), one 5 oz glass of wine (148 mL), or one 1 oz glass of hard liquor (44 mL). General instructions  Schedule regular health, dental, and eye exams.  Stay current with your vaccines.  Tell your health care provider if: ? You often feel depressed. ? You have ever been abused or do not feel safe at home. Summary  Adopting a healthy lifestyle and getting preventive care are important in promoting health and wellness.  Follow your health care provider's instructions about healthy diet, exercising, and getting tested or screened for diseases.  Follow your health care provider's instructions on monitoring your cholesterol and blood pressure. This information is not intended to replace advice given to you by your health care provider. Make sure you discuss any questions you have with your health care provider. Document Released: 04/28/2008 Document Revised: 10/24/2018 Document Reviewed: 10/24/2018 Elsevier Patient Education  2020 [place] DASH stands for "Dietary Approaches to Stop Hypertension." The DASH eating plan is a healthy eating plan that has been shown to reduce high blood pressure (hypertension). It may also reduce your risk for type 2 diabetes, heart disease, and stroke. The DASH eating plan may also help with weight loss. What are tips for following this plan?  General guidelines  Avoid eating more than 2,300 mg (milligrams) of salt (sodium) a day. If you have hypertension, you may need to reduce your sodium intake to 1,500 mg a day.  Limit alcohol intake to no more than 1 drink a day for nonpregnant women and 2 drinks a day for men. One drink equals 12 oz of beer, 5 oz of wine, or 1 oz of hard liquor.  Work with your health care provider to maintain a healthy body weight or to lose weight. Ask what an ideal weight is for you.  Get at least 30 minutes of exercise that causes your heart to beat faster (aerobic exercise) most days of the week. Activities may include walking, swimming,  or biking.  Work with your health care provider or diet and nutrition specialist (dietitian) to adjust your eating plan to your individual calorie needs. Reading food labels   Check food labels for the amount of sodium per serving. Choose foods with less than 5 percent of the Daily Value of sodium. Generally, foods with less than 300 mg of sodium per serving fit into this eating plan.  To find whole grains, look for the word "whole" as the first word in the ingredient list. Shopping  Buy products labeled as "low-sodium" or "no salt added."  Buy fresh foods. Avoid canned foods and premade or frozen meals. Cooking  Avoid adding salt when cooking. Use salt-free seasonings or herbs instead of table salt or sea salt. Check with your health care provider or pharmacist before using salt substitutes.  Do not fry foods. Cook foods using healthy methods such as baking, boiling, grilling, and broiling  instead.  Cook with heart-healthy oils, such as olive, canola, soybean, or sunflower oil. Meal planning  Eat a balanced diet that includes: ? 5 or more servings of fruits and vegetables each day. At each meal, try to fill half of your plate with fruits and vegetables. ? Up to 6-8 servings of whole grains each day. ? Less than 6 oz of lean meat, poultry, or fish each day. A 3-oz serving of meat is about the same size as a deck of cards. One egg equals 1 oz. ? 2 servings of low-fat dairy each day. ? A serving of nuts, seeds, or beans 5 times each week. ? Heart-healthy fats. Healthy fats called Omega-3 fatty acids are found in foods such as flaxseeds and coldwater fish, like sardines, salmon, and mackerel.  Limit how much you eat of the following: ? Canned or prepackaged foods. ? Food that is high in trans fat, such as fried foods. ? Food that is high in saturated fat, such as fatty meat. ? Sweets, desserts, sugary drinks, and other foods with added sugar. ? Full-fat dairy products.  Do not salt foods before eating.  Try to eat at least 2 vegetarian meals each week.  Eat more home-cooked food and less restaurant, buffet, and fast food.  When eating at a restaurant, ask that your food be prepared with less salt or no salt, if possible. What foods are recommended? The items listed may not be a complete list. Talk with your dietitian about what dietary choices are best for you. Grains Whole-grain or whole-wheat bread. Whole-grain or whole-wheat pasta. Brown rice. [place]. Bulgur. Whole-grain and low-sodium cereals. Pita bread. Low-fat, low-sodium crackers. Whole-wheat flour tortillas. Vegetables Fresh or frozen vegetables (raw, steamed, roasted, or grilled). Low-sodium or reduced-sodium tomato and vegetable juice. Low-sodium or reduced-sodium tomato sauce and tomato paste. Low-sodium or reduced-sodium canned vegetables. Fruits All fresh, dried, or frozen fruit. Canned fruit in  natural juice (without added sugar). Meat and other protein foods Skinless chicken or [place]. Ground chicken or [place]. Pork with fat trimmed off. Fish and seafood. Egg whites. Dried beans, peas, or lentils. Unsalted nuts, nut butters, and seeds. Unsalted canned beans. Lean cuts of beef with fat trimmed off. Low-sodium, lean deli meat. Dairy Low-fat (1%) or fat-free (skim) milk. Fat-free, low-fat, or reduced-fat cheeses. Nonfat, low-sodium ricotta or cottage cheese. Low-fat or nonfat yogurt. Low-fat, low-sodium cheese. Fats and oils Soft margarine without trans fats. Vegetable oil. Low-fat, reduced-fat, or light mayonnaise and salad dressings (reduced-sodium). Canola, safflower, olive, soybean, and sunflower oils. Avocado. Seasoning and other foods Herbs.  Spices. Seasoning mixes without salt. Unsalted popcorn and pretzels. Fat-free sweets. What foods are not recommended? The items listed may not be a complete list. Talk with your dietitian about what dietary choices are best for you. Grains Baked goods made with fat, such as croissants, muffins, or some breads. Dry pasta or rice meal packs. Vegetables Creamed or fried vegetables. Vegetables in a cheese sauce. Regular canned vegetables (not low-sodium or reduced-sodium). Regular canned tomato sauce and paste (not low-sodium or reduced-sodium). Regular tomato and vegetable juice (not low-sodium or reduced-sodium). Angie Fava. Olives. Fruits Canned fruit in a light or heavy syrup. Fried fruit. Fruit in cream or butter sauce. Meat and other protein foods Fatty cuts of meat. Ribs. Fried meat. Berniece Salines. Sausage. Bologna and other processed lunch meats. Salami. Fatback. Hotdogs. Bratwurst. Salted nuts and seeds. Canned beans with added salt. Canned or smoked fish. Whole eggs or egg yolks. Chicken or [place] with skin. Dairy Whole or 2% milk, cream, and half-and-half. Whole or full-fat cream cheese. Whole-fat or sweetened yogurt. Full-fat cheese. Nondairy  creamers. Whipped toppings. Processed cheese and cheese spreads. Fats and oils Butter. Stick margarine. Lard. Shortening. Ghee. Bacon fat. Tropical oils, such as coconut, palm kernel, or palm oil. Seasoning and other foods Salted popcorn and pretzels. Onion salt, garlic salt, seasoned salt, table salt, and sea salt. Worcestershire sauce. Tartar sauce. Barbecue sauce. Teriyaki sauce. Soy sauce, including reduced-sodium. Steak sauce. Canned and packaged gravies. Fish sauce. Oyster sauce. Cocktail sauce. Horseradish that you find on the shelf. Ketchup. Mustard. Meat flavorings and tenderizers. Bouillon cubes. Hot sauce and Tabasco sauce. Premade or packaged marinades. Premade or packaged taco seasonings. Relishes. Regular salad dressings. Where to find more information:  National Heart, Lung, and [place]: https://wilson-eaton.com/  American Heart Association: www.heart.org Summary  The DASH eating plan is a healthy eating plan that has been shown to reduce high blood pressure (hypertension). It may also reduce your risk for type 2 diabetes, heart disease, and stroke.  With the DASH eating plan, you should limit salt (sodium) intake to 2,300 mg a day. If you have hypertension, you may need to reduce your sodium intake to 1,500 mg a day.  When on the DASH eating plan, aim to eat more fresh fruits and vegetables, whole grains, lean proteins, low-fat dairy, and heart-healthy fats.  Work with your health care provider or diet and nutrition specialist (dietitian) to adjust your eating plan to your individual calorie needs. This information is not intended to replace advice given to you by your health care provider. Make sure you discuss any questions you have with your health care provider. Document Released: 10/20/2011 Document Revised: 10/13/2017 Document Reviewed: 10/24/2016 Elsevier Patient Education  2020 [employer].

## 2019-08-13 NOTE — Assessment & Plan Note (Signed)
Under good control on current regimen. Continue current regimen. Continue to monitor. Call with any concerns. Refills given. Labs drawn today.   

## 2019-08-13 NOTE — Progress Notes (Signed)
BP 138/80 (BP Location: Left Arm, Cuff Size: Normal)   Pulse (!) 55   Temp 98.5 F (36.9 C) (Oral)   Ht 5' 9.5" (1.765 m)   Wt 186 lb (84.4 kg)   SpO2 98%   BMI 27.07 kg/m    Subjective:    Patient ID: Joe Molina, male    DOB: 21-Dec-1955, 63 y.o.   MRN: [id number]  HPI: Joe Molina is a 63 y.o. male presenting on 08/13/2019 for comprehensive medical examination. Current medical complaints include:  Had a UTI in June- resolved now.  His R elbow has been acting up again. Did very well with voltaren, but has been out.  Has had some decreased libido.  Has been under a lot of stress  HYPERTENSION / HYPERLIPIDEMIA Satisfied with current treatment? yes Duration of hypertension: chronic BP monitoring frequency: not checking BP medication side effects: no Past BP meds: lisinopirl Duration of hyperlipidemia: chronic Cholesterol medication side effects: not on anything Cholesterol supplements: fish oil Past cholesterol medications: none Medication compliance: excellent compliance Aspirin: yes Recent stressors: yes Recurrent headaches: no Visual changes: no Palpitations: no Dyspnea: no Chest pain: no Lower extremity edema: no Dizzy/lightheaded: no  He currently lives with: wife Interim Problems from his last visit: no  Depression Screen done today and results listed below:  Depression screen [facility] 2/9 08/13/2019 09/19/2018 02/07/2017 07/04/2016  Decreased Interest 0 0 0 0  Down, Depressed, Hopeless 0 0 0 0  PHQ - 2 Score 0 0 0 0  Altered sleeping 0 0 - -  Tired, decreased energy 0 0 - -  Change in appetite 0 0 - -  Feeling bad or failure about yourself  0 0 - -  Trouble concentrating 0 0 - -  Moving slowly or fidgety/restless 0 0 - -  Suicidal thoughts 0 0 - -  PHQ-9 Score 0 0 - -  Difficult doing work/chores Not difficult at all Not difficult at all - -    Past Medical History:  Past Medical History:  Diagnosis Date  . Chronic left hip pain 06/25/2015  . DVT (deep  venous thrombosis) ([place]) 11/04/2013  . Factor V Leiden mutation ([place])   . GERD (gastroesophageal reflux disease)   . Hx of pulmonary embolus 11/01/2013  . Hyperlipidemia   . Hypertension   . Personal history of colonic adenomas 10/19/2010  . Subacute massive pulmonary embolism ([place]) 11/03/2013   Massive PE with large clot burden in left lower extremity common femoral vein 11/01/13. S/p TPA,  S/p IVC filter On xarelto, plan minimum one year of Rx Hypercoaguable panel>>> factor V Leiden mutation as only abnormality     Surgical History:  Past Surgical History:  Procedure Laterality Date  . CHOLECYSTECTOMY  06//06   Dr. Deon Pilling  . COLONOSCOPY  06-04-14   per Dr. Carlean Purl, benign polyps, repeat in 5 yrs (hx of adenomas)   . TONSILLECTOMY  1961    Medications:  Current Outpatient Medications on File Prior to Visit  Medication Sig  . aspirin 81 MG tablet Take 162 mg by mouth daily.  . cholecalciferol (VITAMIN D) 1000 UNITS tablet Take 1,000 Units by mouth daily.  Marland Kitchen FIBER PO Take by mouth.  . Glucosamine HCl (GLUCOSAMINE PO) Take by mouth.  . Multiple Vitamin (MULTIVITAMIN) tablet Take 1 tablet by mouth daily. 50 plus  . Omega-3 Fatty Acids (FISH OIL PO) Take 1 capsule by mouth daily.    No current facility-administered medications on file prior to visit.  Allergies:  Allergies  Allergen Reactions  . Codeine Nausea Only and Other (See Comments)  . Codeine Sulfate     REACTION: nausea    Social History:  Social History   Socioeconomic History  . Marital status: Married    Spouse name: Corporate treasurer  . Number of children: 2  . Years of education: 74  . Highest education level: Doctorate  Occupational History  . Occupation: Retired    Comment: Estate agent, Research scientist (physical sciences)  Social Needs  . Financial resource strain: Not hard at all  . Food insecurity    Worry: Never true    Inability: Never true  . Transportation needs    Medical: No    Non-medical: No  Tobacco  Use  . Smoking status: Never Smoker  . Smokeless tobacco: Never Used  Substance and Sexual Activity  . Alcohol use: Yes    Alcohol/week: 14.0 standard drinks    Types: 14 Standard drinks or equivalent per week    Comment: 2 liquor drinks daily  . Drug use: No  . Sexual activity: Yes  Lifestyle  . Physical activity    Days per week: 3 days    Minutes per session: 60 min  . Stress: Not on file  Relationships  . Social Herbalist on phone: Not on file    Gets together: Not on file    Attends religious service: Not on file    Active member of club or organization: Not on file    Attends meetings of clubs or organizations: Not on file    Relationship status: Not on file  . Intimate partner violence    Fear of current or ex partner: Not on file    Emotionally abused: Not on file    Physically abused: Not on file    Forced sexual activity: Not on file  Other Topics Concern  . Not on file  Social History Narrative  . Not on file   Social History   Tobacco Use  Smoking Status Never Smoker  Smokeless Tobacco Never Used   Social History   Substance and Sexual Activity  Alcohol Use Yes  . Alcohol/week: 14.0 standard drinks  . Types: 14 Standard drinks or equivalent per week   Comment: 2 liquor drinks daily    Family History:  Family History  Problem Relation Age of Onset  . Clotting disorder Mother        leg DVT  . Heart disease Mother   . Heart attack Father 18  . Prostate cancer Father 90  . Anemia Sister   . Anemia Brother   . Mental illness Brother   . Colon cancer Neg Hx   . Colon polyps Neg Hx   . Esophageal cancer Neg Hx   . Rectal cancer Neg Hx   . Stomach cancer Neg Hx     Past medical history, surgical history, medications, allergies, family history and social history reviewed with patient today and changes made to appropriate areas of the chart.   Review of Systems  Constitutional: Negative.   HENT: Negative.   Eyes: Negative.    Respiratory: Negative.   Cardiovascular: Negative.   Gastrointestinal: Negative.   Genitourinary: Negative.   Musculoskeletal: Negative.   Skin: Negative.   Neurological: Negative.   Endo/Heme/Allergies: Negative.   Psychiatric/Behavioral: Negative.     All other ROS negative except what is listed above and in the HPI.      Objective:    BP 138/80 (BP  Location: Left Arm, Cuff Size: Normal)   Pulse (!) 55   Temp 98.5 F (36.9 C) (Oral)   Ht 5' 9.5" (1.765 m)   Wt 186 lb (84.4 kg)   SpO2 98%   BMI 27.07 kg/m   Wt Readings from Last 3 Encounters:  08/13/19 186 lb (84.4 kg)  07/03/19 185 lb (83.9 kg)  06/12/19 185 lb (83.9 kg)    Physical Exam Vitals signs and nursing note reviewed.  Constitutional:      General: He is not in acute distress.    Appearance: Normal appearance. He is obese. He is not ill-appearing, toxic-appearing or diaphoretic.  HENT:     Head: Normocephalic and atraumatic.     Right Ear: Tympanic membrane, ear canal and external ear normal. There is no impacted cerumen.     Left Ear: Tympanic membrane, ear canal and external ear normal. There is no impacted cerumen.     Nose: Nose normal. No congestion or rhinorrhea.     Mouth/Throat:     Mouth: Mucous membranes are moist.     Pharynx: Oropharynx is clear. No oropharyngeal exudate or posterior oropharyngeal erythema.  Eyes:     General: No scleral icterus.       Right eye: No discharge.        Left eye: No discharge.     Extraocular Movements: Extraocular movements intact.     Conjunctiva/sclera: Conjunctivae normal.     Pupils: Pupils are equal, round, and reactive to light.  Neck:     Musculoskeletal: Normal range of motion and neck supple. No neck rigidity or muscular tenderness.     Vascular: No carotid bruit.  Cardiovascular:     Rate and Rhythm: Normal rate and regular rhythm.     Pulses: Normal pulses.     Heart sounds: No murmur. No friction rub. No gallop.   Pulmonary:     Effort:  Pulmonary effort is normal. No respiratory distress.     Breath sounds: Normal breath sounds. No stridor. No wheezing, rhonchi or rales.  Chest:     Chest wall: No tenderness.  Abdominal:     General: Abdomen is flat. Bowel sounds are normal. There is no distension.     Palpations: Abdomen is soft. There is no mass.     Tenderness: There is no abdominal tenderness. There is no right CVA tenderness, left CVA tenderness, guarding or rebound.     Hernia: No hernia is present.  Genitourinary:    Comments: Genital exam deferred with shared decision making Musculoskeletal:        General: No swelling, tenderness, deformity or signs of injury.     Right lower leg: No edema.     Left lower leg: No edema.  Lymphadenopathy:     Cervical: No cervical adenopathy.  Skin:    General: Skin is warm and dry.     Capillary Refill: Capillary refill takes less than 2 seconds.     Coloration: Skin is not jaundiced or pale.     Findings: No bruising, erythema, lesion or rash.  Neurological:     General: No focal deficit present.     Mental Status: He is alert and oriented to person, place, and time.     Cranial Nerves: No cranial nerve deficit.     Sensory: No sensory deficit.     Motor: No weakness.     Coordination: Coordination normal.     Gait: Gait normal.     Deep Tendon Reflexes: Reflexes  normal.  Psychiatric:        Mood and Affect: Mood normal.        Behavior: Behavior normal.        Thought Content: Thought content normal.        Judgment: Judgment normal.     Results for orders placed or performed in visit on 02/07/18  Lipid panel  Result Value Ref Range   Cholesterol, Total 212 (H) 100 - 199 mg/dL   Triglycerides 68 0 - 149 mg/dL   HDL 56 >39 mg/dL   VLDL Cholesterol Cal 14 5 - 40 mg/dL   LDL Calculated 142 (H) 0 - 99 mg/dL   Chol/HDL Ratio 3.8 0.0 - 5.0 ratio  Comprehensive metabolic panel  Result Value Ref Range   Glucose 105 (H) 65 - 99 mg/dL   BUN 15 8 - 27 mg/dL    Creatinine, Ser 0.87 0.76 - 1.27 mg/dL   GFR calc non Af Amer 92 >59 mL/min/1.73   GFR calc Af Amer 107 >59 mL/min/1.73   BUN/Creatinine Ratio 17 10 - 24   Sodium 141 134 - 144 mmol/L   Potassium 4.5 3.5 - 5.2 mmol/L   Chloride 103 96 - 106 mmol/L   CO2 21 20 - 29 mmol/L   Calcium 9.2 8.6 - 10.2 mg/dL   Total Protein 6.6 6.0 - 8.5 g/dL   Albumin 4.6 3.6 - 4.8 g/dL   Globulin, Total 2.0 1.5 - 4.5 g/dL   Albumin/Globulin Ratio 2.3 (H) 1.2 - 2.2   Bilirubin Total 2.1 (H) 0.0 - 1.2 mg/dL   Alkaline Phosphatase 56 39 - 117 IU/L   AST 39 0 - 40 IU/L   ALT 25 0 - 44 IU/L  PSA Total (Reflex To Free)  Result Value Ref Range   Prostate Specific Ag, Serum 2.3 0.0 - 4.0 ng/mL   Reflex Criteria Comment   TSH  Result Value Ref Range   TSH 2.480 0.450 - 4.500 uIU/mL  CBC w/Diff/Platelet  Result Value Ref Range   WBC 5.0 3.4 - 10.8 x10E3/uL   RBC 4.56 4.14 - 5.80 x10E6/uL   Hemoglobin 14.9 13.0 - 17.7 g/dL   Hematocrit 43.7 37.5 - 51.0 %   MCV 96 79 - 97 fL   MCH 32.7 26.6 - 33.0 pg   MCHC 34.1 31.5 - 35.7 g/dL   RDW 12.6 12.3 - 15.4 %   Platelets 225 150 - 379 x10E3/uL   Neutrophils 53 Not Estab. %   Lymphs 33 Not Estab. %   Monocytes 12 Not Estab. %   Eos 2 Not Estab. %   Basos 0 Not Estab. %   Neutrophils Absolute 2.7 1.4 - 7.0 x10E3/uL   Lymphocytes Absolute 1.6 0.7 - 3.1 x10E3/uL   Monocytes Absolute 0.6 0.1 - 0.9 x10E3/uL   EOS (ABSOLUTE) 0.1 0.0 - 0.4 x10E3/uL   Basophils Absolute 0.0 0.0 - 0.2 x10E3/uL   Immature Granulocytes 0 Not Estab. %   Immature Grans (Abs) 0.0 0.0 - 0.1 x10E3/uL  Bilirubin, fractionated(tot/dir/indir)  Result Value Ref Range   Bilirubin Total 1.9 (H) 0.0 - 1.2 mg/dL   Bilirubin, Direct 0.35 0.00 - 0.40 mg/dL   Bilirubin, Indirect 1.55 (H) 0.10 - 0.80 mg/dL  Specimen status report  Result Value Ref Range   specimen status report Comment       Assessment & Plan:   Problem List Items Addressed This Visit      Cardiovascular and Mediastinum    Essential hypertension  Under good control on current regimen. Continue current regimen. Continue to monitor. Call with any concerns. Refills given. Labs drawn today.       Relevant Medications   lisinopril (ZESTRIL) 10 MG tablet   Other Relevant Orders   Comprehensive metabolic panel   Microalbumin, Urine Waived   TSH   UA/M w/rflx Culture, Routine     Hematopoietic and Hemostatic   Factor V Leiden mutation ([place])    Maintained on aspirin. Continue to monitor. Follow with hematology as needed.       Relevant Orders   CBC with Differential/Platelet   Comprehensive metabolic panel   UA/M w/rflx Culture, Routine     Other   Hyperlipidemia    Rechecking levels today. Await results. Call with any concerns. Continue to monitor.       Relevant Medications   lisinopril (ZESTRIL) 10 MG tablet   Other Relevant Orders   Comprehensive metabolic panel   Lipid Panel w/o Chol/HDL Ratio   UA/M w/rflx Culture, Routine    Other Visit Diagnoses    Routine general medical examination at a health care facility    -  Primary   Vaccines up to date. Screening labs checked today. Colonoscopy up to date. Continue diet and exercise. Call with any concerns.    Relevant Orders   Hepatitis C Antibody   HIV Antibody (routine testing w rflx)   Screening for prostate cancer       Labs drawn today. Await results.    Relevant Orders   PSA   Flu vaccine need       Flu shot given today.    Relevant Orders   Flu Vaccine QUAD 36+ mos IM (Completed)   Decreased libido       Will check testosterone. Await results. Call with any concerns.    Relevant Orders   Testosterone, free, total([facility])       Discussed aspirin prophylaxis for myocardial infarction prevention and decision was made to continue ASA  LABORATORY TESTING:  Health maintenance labs ordered today as discussed above.   The natural history of prostate cancer and ongoing controversy regarding screening and potential  treatment outcomes of prostate cancer has been discussed with the patient. The meaning of a false positive PSA and a false negative PSA has been discussed. He indicates understanding of the limitations of this screening test and wishes to proceed with screening PSA testing.   IMMUNIZATIONS:   - Tdap: Tetanus vaccination status reviewed: last tetanus booster within 10 years. - Influenza: Administered today - Pneumovax: Not applicable - Prevnar: Not applicable - HPV: Not applicable  - Shingrix: Up to date  SCREENING: - Colonoscopy: Up to date  Discussed with patient purpose of the colonoscopy is to detect colon cancer at curable precancerous or early stages   PATIENT COUNSELING:    Sexuality: Discussed sexually transmitted diseases, partner selection, use of condoms, avoidance of unintended pregnancy  and contraceptive alternatives.   Advised to avoid cigarette smoking.  I discussed with the patient that most people either abstain from alcohol or drink within safe limits (<=14/week and <=4 drinks/occasion for males, <=7/weeks and <= 3 drinks/occasion for females) and that the risk for alcohol disorders and other health effects rises proportionally with the number of drinks per week and how often a drinker exceeds daily limits.  Discussed cessation/primary prevention of drug use and availability of treatment for abuse.   Diet: Encouraged to adjust caloric intake to maintain  or achieve ideal body weight, to reduce intake  of dietary saturated fat and total fat, to limit sodium intake by avoiding high sodium foods and not adding table salt, and to maintain adequate dietary potassium and calcium preferably from fresh fruits, vegetables, and low-fat dairy products.    stressed the importance of regular exercise  Injury prevention: Discussed safety belts, safety helmets, smoke detector, smoking near bedding or upholstery.   Dental health: Discussed importance of regular tooth brushing,  flossing, and dental visits.   Follow up plan: NEXT PREVENTATIVE PHYSICAL DUE IN 1 YEAR. Return in about 6 months (around 02/10/2020) for Follow up.

## 2019-08-13 NOTE — Assessment & Plan Note (Signed)
Rechecking levels today. Await results. Call with any concerns. Continue to monitor.  

## 2019-08-13 NOTE — Assessment & Plan Note (Signed)
Maintained on aspirin. Continue to monitor. Follow with hematology as needed.

## 2019-08-15 ENCOUNTER — Encounter: Payer: Self-pay | Admitting: Family Medicine

## 2019-08-15 LAB — CBC WITH DIFFERENTIAL/PLATELET
Basophils Absolute: 0 10*3/uL (ref 0.0–0.2)
Basos: 1 %
EOS (ABSOLUTE): 0.1 10*3/uL (ref 0.0–0.4)
Eos: 2 %
Hematocrit: 43.4 % (ref 37.5–51.0)
Hemoglobin: 15.4 g/dL (ref 13.0–17.7)
Immature Grans (Abs): 0 10*3/uL (ref 0.0–0.1)
Immature Granulocytes: 0 %
Lymphocytes Absolute: 1.3 10*3/uL (ref 0.7–3.1)
Lymphs: 28 %
MCH: 33.3 pg — ABNORMAL HIGH (ref 26.6–33.0)
MCHC: 35.5 g/dL (ref 31.5–35.7)
MCV: 94 fL (ref 79–97)
Monocytes Absolute: 0.5 10*3/uL (ref 0.1–0.9)
Monocytes: 11 %
Neutrophils Absolute: 2.7 10*3/uL (ref 1.4–7.0)
Neutrophils: 58 %
Platelets: 203 10*3/uL (ref 150–450)
RBC: 4.63 x10E6/uL (ref 4.14–5.80)
RDW: 12 % (ref 11.6–15.4)
WBC: 4.7 10*3/uL (ref 3.4–10.8)

## 2019-08-15 LAB — COMPREHENSIVE METABOLIC PANEL
ALT: 20 IU/L (ref 0–44)
AST: 26 IU/L (ref 0–40)
Albumin/Globulin Ratio: 2.3 — ABNORMAL HIGH (ref 1.2–2.2)
Albumin: 4.6 g/dL (ref 3.8–4.8)
Alkaline Phosphatase: 58 IU/L (ref 39–117)
BUN/Creatinine Ratio: 15 (ref 10–24)
BUN: 13 mg/dL (ref 8–27)
Bilirubin Total: 1.2 mg/dL (ref 0.0–1.2)
CO2: 25 mmol/L (ref 20–29)
Calcium: 9.5 mg/dL (ref 8.6–10.2)
Chloride: 102 mmol/L (ref 96–106)
Creatinine, Ser: 0.89 mg/dL (ref 0.76–1.27)
GFR calc Af Amer: 104 mL/min/{1.73_m2} (ref >59)
GFR calc non Af Amer: 90 mL/min/{1.73_m2} (ref >59)
Globulin, Total: 2 g/dL (ref 1.5–4.5)
Glucose: 101 mg/dL — ABNORMAL HIGH (ref 65–99)
Potassium: 4.3 mmol/L (ref 3.5–5.2)
Sodium: 142 mmol/L (ref 134–144)
Total Protein: 6.6 g/dL (ref 6.0–8.5)

## 2019-08-15 LAB — LIPID PANEL W/O CHOL/HDL RATIO
Cholesterol, Total: 201 mg/dL — ABNORMAL HIGH (ref 100–199)
HDL: 46 mg/dL (ref >39)
LDL Chol Calc (NIH): 137 mg/dL — ABNORMAL HIGH (ref 0–99)
Triglycerides: 98 mg/dL (ref 0–149)
VLDL Cholesterol Cal: 18 mg/dL (ref 5–40)

## 2019-08-15 LAB — TSH: TSH: 2.54 u[IU]/mL (ref 0.450–4.500)

## 2019-08-15 LAB — TESTOSTERONE, FREE, TOTAL, SHBG
Sex Hormone Binding: 80.9 nmol/L — ABNORMAL HIGH (ref 19.3–76.4)
Testosterone, Free: 11.2 pg/mL (ref 6.6–18.1)
Testosterone: 766 ng/dL (ref 264–916)

## 2019-08-15 LAB — HIV ANTIBODY (ROUTINE TESTING W REFLEX): HIV Screen 4th Generation wRfx: NONREACTIVE

## 2019-08-15 LAB — PSA: Prostate Specific Ag, Serum: 3.9 ng/mL (ref 0.0–4.0)

## 2019-08-15 LAB — HEPATITIS C ANTIBODY: Hep C Virus Ab: <0.1 s/co ratio (ref 0.0–0.9)

## 2019-08-22 ENCOUNTER — Encounter: Payer: Self-pay | Admitting: Family Medicine

## 2019-08-25 MED ORDER — LANSOPRAZOLE 30 MG PO CPDR: 30.0000 mg | DELAYED_RELEASE_CAPSULE | Freq: Every day | ORAL | 1 refills | Status: AC

## 2019-08-26 MED FILL — DICLOFENAC SODIUM 1 % GEL: 1 | 7 days supply | Qty: 100 | Fill #0

## 2019-08-26 MED FILL — LISINOPRIL 10 MG TABS: 10 | 90 days supply | Qty: 90 | Fill #0

## 2019-08-26 MED FILL — LANSOPRAZOLE 30 MG CPDR: 30 | 90 days supply | Qty: 90 | Fill #0

## 2019-11-21 ENCOUNTER — Other Ambulatory Visit: Payer: Self-pay

## 2019-11-21 ENCOUNTER — Ambulatory Visit (INDEPENDENT_AMBULATORY_CARE_PROVIDER_SITE_OTHER): Payer: 59 | Admitting: Sports Medicine

## 2019-11-21 DIAGNOSIS — M76899 Other specified enthesopathies of unspecified lower limb, excluding foot: Secondary | ICD-10-CM | POA: Diagnosis not present

## 2019-11-21 MED ORDER — NITROGLYCERIN 0.2 MG/HR TD PT24: MEDICATED_PATCH | TRANSDERMAL | 1 refills | Status: AC

## 2019-11-21 MED FILL — NITROGLYCERIN 0.2 MG/HR PTC: 0.2 | 88 days supply | Qty: 22 | Fill #0

## 2019-11-21 NOTE — Patient Instructions (Addendum)
It was nice to see you today,  The pain in your knee is caused by a small bone spur in your quadricep tendon.  For this, we have prescribed nitroglycerin patch.  You can leave this on for 24 hours at a time before replacing it.  We have also given you a list of exercises and stretches to do.  You can also apply the Voltaren gel to the area up to 4 times a day.  Nitroglycerin Protocol   Apply 1/4 nitroglycerin patch to affected area daily.  Change position of patch within the affected area every 24 hours.  You may experience a headache during the first 1-2 weeks of using the patch, these should subside.  If you experience headaches after beginning nitroglycerin patch treatment, you may take your preferred over the counter pain reliever.  Another side effect of the nitroglycerin patch is skin irritation or rash related to patch adhesive.  Please notify our office if you develop more severe headaches or rash, and stop the patch.  Tendon healing with nitroglycerin patch may require 12 to 24 weeks depending on the extent of injury.  Men should not use if taking Viagra, Cialis, or Levitra.   Do not use if you have migraines or rosacea.    We will follow-up with you in 1 month for a virtual visit.  Have a great day

## 2019-11-21 NOTE — Addendum Note (Signed)
Addended by: Lilia Argue R on: 11/21/2019 02:00 PM   Modules accepted: Level of Service

## 2019-11-21 NOTE — Assessment & Plan Note (Addendum)
Due to bone spur.  Endorses pain above the patella, that is worse with going up stairs.  On exam had pain with knee extension.  Mild crepitus in right knee.  Previous x-rays from 2017 reviewed, consistent with some minor arthritis.  Ultrasound of the quadriceps tendon showed no effusion, but did show a bone spur in the location of the patient's pain. -Nitroglycerin patch daily -Voltaren gel up to 4 times a day as needed. -Quad strengthening exercises and stretches. -Follow-up for virtual visit in 1 month.

## 2019-11-21 NOTE — Progress Notes (Addendum)
   HPI  CC: Right knee pain   Right knee pain: This is the same pain that the patient has had since his last visit in 2017.  At that time he gave him a cortisone injection he states it got "a little better", but then 3 months ago he was working at his home and going up and down a ladder and since that time.  When asked to point with 1 finger to where it hurts the most, he points directly above the patella.  Patient is able to walk 4 to 5 miles a day without difficulty, but going up  stairs is painful.  No buckling of the knee, but does state that it feels "weak" sometimes.  Has tried Voltaren gel and NSAIDs, but not consistently.  Medications/Interventions Tried: Advil, Voltaren gel  See HPI and/or previous note for associated ROS.  Objective: BP (!) 142/90   Ht 5\' 10"  (1.778 m)   Wt 180 lb (81.6 kg)   BMI 25.83 kg/m  Gen:  NAD, well groomed, a/o x3, normal affect.  CV: Well-perfused. Warm.  Resp: Non-labored.  Neuro: Sensation intact throughout. No gross coordination deficits.  Gait: Nonpathologic posture, unremarkable stride without signs of limp or balance issues. MSK: Left and right knee symmetrical in appearance.  Normal active and passive range of motion in the hip knee and ankle.  No tenderness to palpation.  5/5 strength in the knee, but does endorse pain above the patella with leg extension.  Thessaly test negative.  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  Endorses pain above the patella, that is worse with going up stairs.  On exam had pain with knee extension.  Mild crepitus in right knee.  Previous x-rays from 2017 reviewed, consistent with some minor arthritis.  Ultrasound of the quadriceps tendon showed no effusion, but did show a bone spur in the location of the patient's pain. -Nitroglycerin patch daily -Voltaren gel up to 4 times a day as needed. -Quad strengthening exercises and stretches. -Follow-up for virtual visit in 1 month.   No orders  of the defined types were placed in this encounter.   Meds ordered this encounter  Medications  . nitroGLYCERIN (NITRODUR - DOSED IN MG/24 HR) 0.2 mg/hr patch    Sig: Use 1/4 patch daily to the affected area    Dispense:  30 patch    Refill:  [place], MD [facility] PGY2 11/21/2019 12:00 PM  Patient seen and evaluated with the resident.  I agree with the above plan of care.  I reviewed x-rays of the right knee done previously and there are only mild arthritic changes.  Bedside ultrasound today showed no evidence of joint effusion but there is a spur at the insertion point of the quadriceps tendon.  There are mild hypoechoic changes in this area as well.  Physical exam shows full range of motion with no effusion.  Good stability.  Negative Thessaly's.  But he does have reproducible pain with resisted knee extension and he localizes that pain to the distal quadriceps tendon.  Therefore, I think this is his source of pain so we will treat with topical nitroglycerin as above and he will start isometric quad exercises.  He lives about 19 miles away so we will set up a virtual follow-up visit in 4 weeks.

## 2019-12-02 MED FILL — LANSOPRAZOLE 30 MG CPDR: 30 | 90 days supply | Qty: 90 | Fill #1

## 2019-12-26 ENCOUNTER — Telehealth (INDEPENDENT_AMBULATORY_CARE_PROVIDER_SITE_OTHER): Payer: 59 | Admitting: Sports Medicine

## 2019-12-26 ENCOUNTER — Encounter (HOSPITAL_COMMUNITY): Payer: 59

## 2019-12-26 ENCOUNTER — Encounter: Payer: Self-pay | Admitting: Sports Medicine

## 2019-12-26 VITALS — Ht 70.0 in | Wt 180.0 lb

## 2019-12-26 DIAGNOSIS — M25561 Pain in right knee: Secondary | ICD-10-CM | POA: Diagnosis not present

## 2019-12-26 NOTE — Progress Notes (Signed)
Patient ID: Joe Molina, male   DOB: [birth year], 64 y.o.   MRN: [id number]  I connected with Joe Molina today via video visit for follow-up on right knee pain.  Unfortunately, he continues to struggle with pain.  It is most noticeable with climbing stairs, climbing on and off of his tractor, and walking on a steep incline.  His pain is focused in the anterior knee both below and above the patella.  He has not noticed any swelling but does endorse stiffness.  He also endorses catching around the patella when going from a seated to a standing position.  Physical exam was not performed due to the nature of this video visit.  I was able to obtain the MRI report of his right knee done at [facility] in 2018.  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  I discussed merits of updated imaging versus cortisone injection.  He would like to try cortisone injection first.  He lives over an hour away from our clinic and would like to try to find an orthopedist or sports medicine doctor closer to home for this.  He understands that if symptoms persist after the injection then we should consider updated imaging.

## 2019-12-30 DIAGNOSIS — M2550 Pain in unspecified joint: Secondary | ICD-10-CM | POA: Diagnosis not present

## 2019-12-30 DIAGNOSIS — M25561 Pain in right knee: Secondary | ICD-10-CM | POA: Diagnosis not present

## 2019-12-30 DIAGNOSIS — D513 Other dietary vitamin B12 deficiency anemia: Secondary | ICD-10-CM | POA: Diagnosis not present

## 2019-12-30 DIAGNOSIS — E610 Copper deficiency: Secondary | ICD-10-CM | POA: Diagnosis not present

## 2019-12-30 DIAGNOSIS — E559 Vitamin D deficiency, unspecified: Secondary | ICD-10-CM | POA: Diagnosis not present

## 2019-12-30 DIAGNOSIS — E299 Testicular dysfunction, unspecified: Secondary | ICD-10-CM | POA: Diagnosis not present

## 2019-12-30 DIAGNOSIS — E278 Other specified disorders of adrenal gland: Secondary | ICD-10-CM | POA: Diagnosis not present

## 2019-12-30 DIAGNOSIS — M1711 Unilateral primary osteoarthritis, right knee: Secondary | ICD-10-CM | POA: Diagnosis not present

## 2019-12-30 DIAGNOSIS — E60 Dietary zinc deficiency: Secondary | ICD-10-CM | POA: Diagnosis not present

## 2020-01-10 DIAGNOSIS — E559 Vitamin D deficiency, unspecified: Secondary | ICD-10-CM | POA: Diagnosis not present

## 2020-01-10 DIAGNOSIS — E612 Magnesium deficiency: Secondary | ICD-10-CM | POA: Diagnosis not present

## 2020-01-10 DIAGNOSIS — D513 Other dietary vitamin B12 deficiency anemia: Secondary | ICD-10-CM | POA: Diagnosis not present

## 2020-01-10 DIAGNOSIS — E278 Other specified disorders of adrenal gland: Secondary | ICD-10-CM | POA: Diagnosis not present

## 2020-01-10 DIAGNOSIS — M2550 Pain in unspecified joint: Secondary | ICD-10-CM | POA: Diagnosis not present

## 2020-01-10 DIAGNOSIS — E610 Copper deficiency: Secondary | ICD-10-CM | POA: Diagnosis not present

## 2020-01-10 DIAGNOSIS — E299 Testicular dysfunction, unspecified: Secondary | ICD-10-CM | POA: Diagnosis not present

## 2020-01-10 DIAGNOSIS — Z832 Family history of diseases of the blood and blood-forming organs and certain disorders involving the immune mechanism: Secondary | ICD-10-CM | POA: Diagnosis not present

## 2020-01-10 DIAGNOSIS — E60 Dietary zinc deficiency: Secondary | ICD-10-CM | POA: Diagnosis not present

## 2020-01-27 DIAGNOSIS — M1711 Unilateral primary osteoarthritis, right knee: Secondary | ICD-10-CM | POA: Diagnosis not present

## 2020-01-27 DIAGNOSIS — M25561 Pain in right knee: Secondary | ICD-10-CM | POA: Diagnosis not present

## 2020-01-27 DIAGNOSIS — Z712 Person consulting for explanation of examination or test findings: Secondary | ICD-10-CM | POA: Diagnosis not present

## 2020-03-02 ENCOUNTER — Encounter: Payer: Self-pay | Admitting: Sports Medicine

## 2020-04-05 MED FILL — LISINOPRIL 10 MG TABS: 10 | 90 days supply | Qty: 90 | Fill #1

## 2020-07-22 ENCOUNTER — Encounter: Payer: Self-pay | Admitting: Family Medicine

## 2020-08-14 ENCOUNTER — Encounter: Payer: 59 | Admitting: Family Medicine

## 2020-08-31 ENCOUNTER — Encounter: Payer: 59 | Admitting: Family Medicine
